# Patient Record
Sex: Female | Born: 1965
Health system: Southern US, Community
[De-identification: ages and names within clinical notes are randomized; demographics above are authoritative.]

## PROBLEM LIST (undated history)

## (undated) DIAGNOSIS — G5600 Carpal tunnel syndrome, unspecified upper limb: Secondary | ICD-10-CM

## (undated) DIAGNOSIS — I10 Essential (primary) hypertension: Secondary | ICD-10-CM

## (undated) DIAGNOSIS — M1990 Unspecified osteoarthritis, unspecified site: Secondary | ICD-10-CM

## (undated) DIAGNOSIS — T8859XA Other complications of anesthesia, initial encounter: Secondary | ICD-10-CM

## (undated) DIAGNOSIS — E669 Obesity, unspecified: Secondary | ICD-10-CM

## (undated) DIAGNOSIS — I82409 Acute embolism and thrombosis of unspecified deep veins of unspecified lower extremity: Secondary | ICD-10-CM

## (undated) DIAGNOSIS — I2699 Other pulmonary embolism without acute cor pulmonale: Secondary | ICD-10-CM

## (undated) DIAGNOSIS — E559 Vitamin D deficiency, unspecified: Secondary | ICD-10-CM

## (undated) DIAGNOSIS — T4145XA Adverse effect of unspecified anesthetic, initial encounter: Secondary | ICD-10-CM

## (undated) DIAGNOSIS — R079 Chest pain, unspecified: Secondary | ICD-10-CM

## (undated) HISTORY — DX: Vitamin D deficiency, unspecified: E55.9

## (undated) HISTORY — PX: CARPAL TUNNEL RELEASE: SHX101

## (undated) HISTORY — DX: Unspecified osteoarthritis, unspecified site: M19.90

## (undated) HISTORY — DX: Obesity, unspecified: E66.9

## (undated) HISTORY — DX: Carpal tunnel syndrome, unspecified upper limb: G56.00

## (undated) HISTORY — DX: Acute embolism and thrombosis of unspecified deep veins of unspecified lower extremity: I82.409

## (undated) HISTORY — PX: TUBAL LIGATION: SHX77

## (undated) HISTORY — PX: KNEE SURGERY: SHX244

## (undated) HISTORY — DX: Other pulmonary embolism without acute cor pulmonale: I26.99

## (undated) HISTORY — DX: Essential (primary) hypertension: I10

---

## 1999-06-13 ENCOUNTER — Emergency Department (HOSPITAL_COMMUNITY): Admission: EM | Admit: 1999-06-13 | Discharge: 1999-06-13 | Payer: Self-pay | Admitting: Emergency Medicine

## 1999-06-13 ENCOUNTER — Encounter: Payer: Self-pay | Admitting: Emergency Medicine

## 1999-11-03 ENCOUNTER — Encounter: Payer: Self-pay | Admitting: Emergency Medicine

## 1999-11-03 ENCOUNTER — Inpatient Hospital Stay: Admission: EM | Admit: 1999-11-03 | Discharge: 1999-11-04 | Payer: Self-pay | Admitting: Emergency Medicine

## 1999-11-04 ENCOUNTER — Encounter: Payer: Self-pay | Admitting: Cardiovascular Disease

## 2000-01-22 ENCOUNTER — Emergency Department (HOSPITAL_COMMUNITY): Admission: EM | Admit: 2000-01-22 | Discharge: 2000-01-22 | Payer: Self-pay | Admitting: Internal Medicine

## 2000-02-03 ENCOUNTER — Encounter: Payer: Self-pay | Admitting: Emergency Medicine

## 2000-02-03 ENCOUNTER — Emergency Department (HOSPITAL_COMMUNITY): Admission: EM | Admit: 2000-02-03 | Discharge: 2000-02-03 | Payer: Self-pay | Admitting: Emergency Medicine

## 2000-02-11 ENCOUNTER — Emergency Department (HOSPITAL_COMMUNITY): Admission: EM | Admit: 2000-02-11 | Discharge: 2000-02-11 | Payer: Self-pay | Admitting: Emergency Medicine

## 2000-09-01 ENCOUNTER — Emergency Department (HOSPITAL_COMMUNITY): Admission: EM | Admit: 2000-09-01 | Discharge: 2000-09-01 | Payer: Self-pay | Admitting: Emergency Medicine

## 2000-09-04 ENCOUNTER — Inpatient Hospital Stay (HOSPITAL_COMMUNITY): Admission: RE | Admit: 2000-09-04 | Discharge: 2000-09-06 | Payer: Self-pay | Admitting: Cardiology

## 2000-09-04 ENCOUNTER — Encounter: Payer: Self-pay | Admitting: Cardiology

## 2000-09-05 ENCOUNTER — Encounter: Payer: Self-pay | Admitting: Cardiology

## 2001-07-12 ENCOUNTER — Emergency Department (HOSPITAL_COMMUNITY): Admission: EM | Admit: 2001-07-12 | Discharge: 2001-07-12 | Payer: Self-pay | Admitting: Emergency Medicine

## 2002-08-05 ENCOUNTER — Emergency Department (HOSPITAL_COMMUNITY): Admission: EM | Admit: 2002-08-05 | Discharge: 2002-08-06 | Payer: Self-pay | Admitting: Emergency Medicine

## 2003-01-17 ENCOUNTER — Encounter: Payer: Self-pay | Admitting: *Deleted

## 2003-01-18 ENCOUNTER — Inpatient Hospital Stay (HOSPITAL_COMMUNITY): Admission: EM | Admit: 2003-01-18 | Discharge: 2003-01-20 | Payer: Self-pay | Admitting: *Deleted

## 2003-01-20 ENCOUNTER — Encounter: Payer: Self-pay | Admitting: Cardiology

## 2003-02-03 ENCOUNTER — Ambulatory Visit (HOSPITAL_COMMUNITY): Admission: RE | Admit: 2003-02-03 | Discharge: 2003-02-03 | Payer: Self-pay | Admitting: Cardiology

## 2003-05-22 ENCOUNTER — Emergency Department (HOSPITAL_COMMUNITY): Admission: EM | Admit: 2003-05-22 | Discharge: 2003-05-22 | Payer: Self-pay | Admitting: Emergency Medicine

## 2004-02-13 ENCOUNTER — Emergency Department (HOSPITAL_COMMUNITY): Admission: EM | Admit: 2004-02-13 | Discharge: 2004-02-14 | Payer: Self-pay | Admitting: Emergency Medicine

## 2004-03-01 ENCOUNTER — Ambulatory Visit: Admission: RE | Admit: 2004-03-01 | Discharge: 2004-03-01 | Payer: Self-pay | Admitting: Cardiology

## 2004-03-01 ENCOUNTER — Encounter (INDEPENDENT_AMBULATORY_CARE_PROVIDER_SITE_OTHER): Payer: Self-pay | Admitting: Cardiology

## 2005-03-06 ENCOUNTER — Ambulatory Visit: Payer: Self-pay | Admitting: Family Medicine

## 2005-04-27 ENCOUNTER — Ambulatory Visit: Payer: Self-pay | Admitting: Internal Medicine

## 2005-04-27 ENCOUNTER — Other Ambulatory Visit: Admission: RE | Admit: 2005-04-27 | Discharge: 2005-04-27 | Payer: Self-pay | Admitting: *Deleted

## 2005-06-22 ENCOUNTER — Emergency Department (HOSPITAL_COMMUNITY): Admission: EM | Admit: 2005-06-22 | Discharge: 2005-06-22 | Payer: Self-pay | Admitting: Emergency Medicine

## 2005-06-27 ENCOUNTER — Ambulatory Visit: Payer: Self-pay | Admitting: Family Medicine

## 2005-06-28 ENCOUNTER — Ambulatory Visit: Payer: Self-pay | Admitting: Family Medicine

## 2006-01-25 ENCOUNTER — Ambulatory Visit: Payer: Self-pay | Admitting: Family Medicine

## 2006-02-01 ENCOUNTER — Ambulatory Visit: Payer: Self-pay | Admitting: Family Medicine

## 2006-02-28 ENCOUNTER — Ambulatory Visit: Payer: Self-pay | Admitting: Family Medicine

## 2006-03-07 ENCOUNTER — Encounter: Admission: RE | Admit: 2006-03-07 | Discharge: 2006-03-07 | Payer: Self-pay | Admitting: *Deleted

## 2006-03-15 ENCOUNTER — Ambulatory Visit (HOSPITAL_COMMUNITY): Admission: RE | Admit: 2006-03-15 | Discharge: 2006-03-15 | Payer: Self-pay | Admitting: *Deleted

## 2006-03-19 ENCOUNTER — Ambulatory Visit: Payer: Self-pay | Admitting: Internal Medicine

## 2006-03-19 ENCOUNTER — Inpatient Hospital Stay (HOSPITAL_COMMUNITY): Admission: EM | Admit: 2006-03-19 | Discharge: 2006-03-28 | Payer: Self-pay | Admitting: Emergency Medicine

## 2006-03-29 ENCOUNTER — Ambulatory Visit: Payer: Self-pay | Admitting: Family Medicine

## 2006-03-31 ENCOUNTER — Encounter (INDEPENDENT_AMBULATORY_CARE_PROVIDER_SITE_OTHER): Payer: Self-pay | Admitting: *Deleted

## 2006-03-31 ENCOUNTER — Ambulatory Visit (HOSPITAL_COMMUNITY): Admission: AD | Admit: 2006-03-31 | Discharge: 2006-03-31 | Payer: Self-pay | Admitting: *Deleted

## 2006-04-03 ENCOUNTER — Ambulatory Visit: Payer: Self-pay | Admitting: Family Medicine

## 2006-04-10 ENCOUNTER — Ambulatory Visit: Payer: Self-pay | Admitting: Family Medicine

## 2006-04-23 ENCOUNTER — Ambulatory Visit: Payer: Self-pay | Admitting: Family Medicine

## 2006-05-02 ENCOUNTER — Ambulatory Visit: Payer: Self-pay | Admitting: Internal Medicine

## 2006-05-17 ENCOUNTER — Ambulatory Visit: Payer: Self-pay | Admitting: Internal Medicine

## 2006-05-18 ENCOUNTER — Ambulatory Visit: Payer: Self-pay | Admitting: Cardiology

## 2006-06-14 ENCOUNTER — Ambulatory Visit: Payer: Self-pay | Admitting: Internal Medicine

## 2006-12-20 ENCOUNTER — Encounter: Admission: RE | Admit: 2006-12-20 | Discharge: 2006-12-20 | Payer: Self-pay | Admitting: Family Medicine

## 2007-01-03 ENCOUNTER — Encounter: Admission: RE | Admit: 2007-01-03 | Discharge: 2007-01-03 | Payer: Self-pay | Admitting: Obstetrics and Gynecology

## 2007-01-30 ENCOUNTER — Ambulatory Visit (HOSPITAL_COMMUNITY): Admission: RE | Admit: 2007-01-30 | Discharge: 2007-01-30 | Payer: Self-pay | Admitting: Internal Medicine

## 2007-01-30 ENCOUNTER — Ambulatory Visit: Payer: Self-pay | Admitting: Internal Medicine

## 2007-06-13 ENCOUNTER — Emergency Department (HOSPITAL_COMMUNITY): Admission: EM | Admit: 2007-06-13 | Discharge: 2007-06-14 | Payer: Self-pay | Admitting: Emergency Medicine

## 2009-01-20 ENCOUNTER — Emergency Department (HOSPITAL_COMMUNITY): Admission: EM | Admit: 2009-01-20 | Discharge: 2009-01-20 | Payer: Self-pay | Admitting: Emergency Medicine

## 2009-01-24 ENCOUNTER — Inpatient Hospital Stay (HOSPITAL_COMMUNITY): Admission: EM | Admit: 2009-01-24 | Discharge: 2009-01-27 | Payer: Self-pay | Admitting: Cardiology

## 2009-01-24 ENCOUNTER — Encounter: Payer: Self-pay | Admitting: *Deleted

## 2010-02-13 ENCOUNTER — Ambulatory Visit: Payer: Self-pay | Admitting: Physician Assistant

## 2010-02-13 ENCOUNTER — Inpatient Hospital Stay (HOSPITAL_COMMUNITY): Admission: AD | Admit: 2010-02-13 | Discharge: 2010-02-14 | Payer: Self-pay | Admitting: Obstetrics and Gynecology

## 2010-02-16 ENCOUNTER — Inpatient Hospital Stay (HOSPITAL_COMMUNITY): Admission: AD | Admit: 2010-02-16 | Discharge: 2010-02-16 | Payer: Self-pay | Admitting: Obstetrics & Gynecology

## 2010-10-23 ENCOUNTER — Emergency Department (HOSPITAL_COMMUNITY)
Admission: EM | Admit: 2010-10-23 | Discharge: 2010-10-23 | Payer: Self-pay | Source: Home / Self Care | Admitting: Emergency Medicine

## 2010-10-23 ENCOUNTER — Encounter (INDEPENDENT_AMBULATORY_CARE_PROVIDER_SITE_OTHER): Payer: Self-pay | Admitting: Emergency Medicine

## 2010-11-13 HISTORY — PX: LEFT HEART CATH: CATH118248

## 2010-12-04 ENCOUNTER — Encounter: Payer: Self-pay | Admitting: *Deleted

## 2010-12-04 ENCOUNTER — Encounter: Payer: Self-pay | Admitting: Physical Medicine and Rehabilitation

## 2010-12-04 ENCOUNTER — Encounter: Payer: Self-pay | Admitting: Family Medicine

## 2011-02-01 LAB — URINALYSIS, ROUTINE W REFLEX MICROSCOPIC
Ketones, ur: NEGATIVE mg/dL
Leukocytes, UA: NEGATIVE
Nitrite: NEGATIVE
Protein, ur: NEGATIVE mg/dL
Urobilinogen, UA: 1 mg/dL (ref 0.0–1.0)
pH: 6 (ref 5.0–8.0)

## 2011-02-01 LAB — GC/CHLAMYDIA PROBE AMP, GENITAL: GC Probe Amp, Genital: NEGATIVE

## 2011-02-01 LAB — CBC
HCT: 43.5 % (ref 36.0–46.0)
MCV: 83.5 fL (ref 78.0–100.0)
Platelets: 284 10*3/uL (ref 150–400)
RBC: 4.83 MIL/uL (ref 3.87–5.11)
RBC: 5.21 MIL/uL — ABNORMAL HIGH (ref 3.87–5.11)
WBC: 10.3 10*3/uL (ref 4.0–10.5)
WBC: 10.4 10*3/uL (ref 4.0–10.5)

## 2011-02-01 LAB — URINE MICROSCOPIC-ADD ON

## 2011-02-01 LAB — POCT PREGNANCY, URINE: Preg Test, Ur: NEGATIVE

## 2011-02-01 LAB — WET PREP, GENITAL

## 2011-02-23 ENCOUNTER — Inpatient Hospital Stay (HOSPITAL_COMMUNITY)
Admission: AD | Admit: 2011-02-23 | Discharge: 2011-02-23 | Disposition: A | Discharge status: Home / Self Care | Payer: Self-pay | Source: Ambulatory Visit | Attending: Obstetrics & Gynecology | Admitting: Obstetrics & Gynecology

## 2011-02-23 DIAGNOSIS — N949 Unspecified condition associated with female genital organs and menstrual cycle: Secondary | ICD-10-CM | POA: Insufficient documentation

## 2011-02-23 DIAGNOSIS — N938 Other specified abnormal uterine and vaginal bleeding: Secondary | ICD-10-CM | POA: Insufficient documentation

## 2011-02-23 LAB — CBC
HCT: 37.1 % (ref 36.0–46.0)
HCT: 38.2 % (ref 36.0–46.0)
HCT: 46.4 % — ABNORMAL HIGH (ref 36.0–46.0)
Hemoglobin: 12.4 g/dL (ref 12.0–15.0)
Hemoglobin: 12.6 g/dL (ref 12.0–15.0)
Hemoglobin: 12.7 g/dL (ref 12.0–15.0)
Hemoglobin: 12.9 g/dL (ref 12.0–15.0)
MCHC: 32.9 g/dL (ref 30.0–36.0)
MCHC: 33.7 g/dL (ref 30.0–36.0)
MCV: 83.4 fL (ref 78.0–100.0)
MCV: 81.8 fL (ref 78.0–100.0)
MCV: 83.2 fL (ref 78.0–100.0)
Platelets: 218 10*3/uL (ref 150–400)
Platelets: 275 10*3/uL (ref 150–400)
RBC: 4.51 MIL/uL (ref 3.87–5.11)
RBC: 4.66 MIL/uL (ref 3.87–5.11)
RBC: 4.7 MIL/uL (ref 3.87–5.11)
RBC: 5.57 MIL/uL — ABNORMAL HIGH (ref 3.87–5.11)
RDW: 15.3 % (ref 11.5–15.5)
RDW: 15.4 % (ref 11.5–15.5)
WBC: 9.5 10*3/uL (ref 4.0–10.5)
WBC: 7.9 10*3/uL (ref 4.0–10.5)
WBC: 8.4 10*3/uL (ref 4.0–10.5)

## 2011-02-23 LAB — HEPARIN LEVEL (UNFRACTIONATED)
Heparin Unfractionated: 0.81 IU/mL — ABNORMAL HIGH (ref 0.30–0.70)
Heparin Unfractionated: 0.37 IU/mL (ref 0.30–0.70)

## 2011-02-23 LAB — CARDIAC PANEL(CRET KIN+CKTOT+MB+TROPI)
CK, MB: 0.6 ng/mL (ref 0.3–4.0)
CK, MB: 0.7 ng/mL (ref 0.3–4.0)
Relative Index: INVALID (ref 0.0–2.5)
Relative Index: INVALID (ref 0.0–2.5)
Total CK: 109 U/L (ref 7–177)
Total CK: 95 U/L (ref 7–177)
Troponin I: <0.01 ng/mL (ref 0.00–0.06)
Troponin I: <0.01 ng/mL (ref 0.00–0.06)

## 2011-02-23 LAB — COMPREHENSIVE METABOLIC PANEL
ALT: 14 U/L (ref 0–35)
AST: 19 U/L (ref 0–37)
Alkaline Phosphatase: 52 U/L (ref 39–117)
CO2: 28 mEq/L (ref 19–32)
Calcium: 8.4 mg/dL (ref 8.4–10.5)
Chloride: 99 mEq/L (ref 96–112)
GFR calc Af Amer: >60 mL/min (ref >60)
GFR calc non Af Amer: >60 mL/min (ref >60)
Glucose, Bld: 111 mg/dL — ABNORMAL HIGH (ref 70–99)
Potassium: 3.8 mEq/L (ref 3.5–5.1)
Sodium: 134 mEq/L — ABNORMAL LOW (ref 135–145)
Total Bilirubin: 0.5 mg/dL (ref 0.3–1.2)

## 2011-02-23 LAB — APTT: aPTT: 28 seconds (ref 24–37)

## 2011-02-23 LAB — BASIC METABOLIC PANEL
CO2: 27 mEq/L (ref 19–32)
Calcium: 9.5 mg/dL (ref 8.4–10.5)
Chloride: 103 mEq/L (ref 96–112)
Creatinine, Ser: 0.76 mg/dL (ref 0.4–1.2)
GFR calc Af Amer: >60 mL/min (ref >60)
GFR calc Af Amer: >60 mL/min (ref >60)
GFR calc non Af Amer: >60 mL/min (ref >60)
Glucose, Bld: 104 mg/dL — ABNORMAL HIGH (ref 70–99)
Potassium: 4.2 mEq/L (ref 3.5–5.1)
Sodium: 139 mEq/L (ref 135–145)

## 2011-02-23 LAB — ETHANOL: Alcohol, Ethyl (B): <5 mg/dL (ref 0–10)

## 2011-02-23 LAB — DIFFERENTIAL
Basophils Relative: 0 % (ref 0–1)
Eosinophils Relative: 1 % (ref 0–5)
Lymphs Abs: 2.1 10*3/uL (ref 0.7–4.0)
Monocytes Absolute: 0.8 10*3/uL (ref 0.1–1.0)
Monocytes Relative: 10 % (ref 3–12)

## 2011-02-23 LAB — PROTIME-INR
INR: 1 (ref 0.00–1.49)
INR: 1 (ref 0.00–1.49)

## 2011-02-23 LAB — URINALYSIS, ROUTINE W REFLEX MICROSCOPIC
Bilirubin Urine: NEGATIVE
Glucose, UA: NEGATIVE mg/dL
Hgb urine dipstick: NEGATIVE
Ketones, ur: NEGATIVE mg/dL
Nitrite: NEGATIVE
Protein, ur: NEGATIVE mg/dL
Urobilinogen, UA: 0.2 mg/dL (ref 0.0–1.0)
pH: 6.5 (ref 5.0–8.0)

## 2011-02-23 LAB — LIPID PANEL
HDL: 43 mg/dL (ref >39)
Total CHOL/HDL Ratio: 4 RATIO

## 2011-02-23 LAB — POCT CARDIAC MARKERS
CKMB, poc: <1 ng/mL — ABNORMAL LOW (ref 1.0–8.0)
Troponin i, poc: <0.05 ng/mL (ref 0.00–0.09)

## 2011-02-23 LAB — HEMOGLOBIN A1C: Hgb A1c MFr Bld: 5.9 % (ref 4.6–6.1)

## 2011-02-23 LAB — CK TOTAL AND CKMB (NOT AT ARMC)
CK, MB: 1.2 ng/mL (ref 0.3–4.0)
Relative Index: 0.9 (ref 0.0–2.5)
Total CK: 132 U/L (ref 7–177)

## 2011-02-23 LAB — URINE CULTURE

## 2011-02-23 LAB — RAPID URINE DRUG SCREEN, HOSP PERFORMED: Tetrahydrocannabinol: NOT DETECTED

## 2011-03-01 ENCOUNTER — Inpatient Hospital Stay (HOSPITAL_COMMUNITY): Payer: Self-pay

## 2011-03-01 ENCOUNTER — Inpatient Hospital Stay (HOSPITAL_COMMUNITY)
Admission: AD | Admit: 2011-03-01 | Discharge: 2011-03-01 | Disposition: A | Discharge status: Home / Self Care | Payer: Self-pay | Source: Ambulatory Visit | Attending: Obstetrics & Gynecology | Admitting: Obstetrics & Gynecology

## 2011-03-01 DIAGNOSIS — N949 Unspecified condition associated with female genital organs and menstrual cycle: Secondary | ICD-10-CM

## 2011-03-01 DIAGNOSIS — N938 Other specified abnormal uterine and vaginal bleeding: Secondary | ICD-10-CM | POA: Insufficient documentation

## 2011-03-01 LAB — URINALYSIS, ROUTINE W REFLEX MICROSCOPIC
Ketones, ur: NEGATIVE mg/dL
Leukocytes, UA: NEGATIVE
Protein, ur: NEGATIVE mg/dL
Urobilinogen, UA: 0.2 mg/dL (ref 0.0–1.0)

## 2011-03-01 LAB — WET PREP, GENITAL
Clue Cells Wet Prep HPF POC: NONE SEEN
Yeast Wet Prep HPF POC: NONE SEEN

## 2011-03-01 LAB — CBC
Platelets: 268 10*3/uL (ref 150–400)
RBC: 5.2 MIL/uL — ABNORMAL HIGH (ref 3.87–5.11)
WBC: 8.7 10*3/uL (ref 4.0–10.5)

## 2011-03-01 LAB — URINE MICROSCOPIC-ADD ON

## 2011-03-02 LAB — GC/CHLAMYDIA PROBE AMP, GENITAL: Chlamydia, DNA Probe: NEGATIVE

## 2011-03-24 ENCOUNTER — Inpatient Hospital Stay (INDEPENDENT_AMBULATORY_CARE_PROVIDER_SITE_OTHER)
Admission: RE | Admit: 2011-03-24 | Discharge: 2011-03-24 | Disposition: A | Discharge status: Home / Self Care | Payer: Self-pay | Source: Ambulatory Visit | Attending: Family Medicine | Admitting: Family Medicine

## 2011-03-24 ENCOUNTER — Ambulatory Visit (INDEPENDENT_AMBULATORY_CARE_PROVIDER_SITE_OTHER): Payer: Self-pay | Admitting: Physician Assistant

## 2011-03-24 ENCOUNTER — Encounter: Payer: Self-pay | Admitting: Family Medicine

## 2011-03-24 DIAGNOSIS — R6889 Other general symptoms and signs: Secondary | ICD-10-CM

## 2011-03-24 DIAGNOSIS — N938 Other specified abnormal uterine and vaginal bleeding: Secondary | ICD-10-CM

## 2011-03-24 DIAGNOSIS — N949 Unspecified condition associated with female genital organs and menstrual cycle: Secondary | ICD-10-CM

## 2011-03-28 NOTE — Discharge Summary (Signed)
NAMECONSTANCE, WHITTLE               ACCOUNT NO.:  [id number]   MEDICAL RECORD NO.:  [id number]          PATIENT TYPE:  INP   LOCATION:  3739                         FACILITY:  [facility]   PHYSICIAN:  Mohan N. Sharyn Lull, M.D. DATE OF BIRTH:  April 15, 1966   DATE OF ADMISSION:  01/24/2009  DATE OF DISCHARGE:  01/27/2009                               DISCHARGE SUMMARY   ADMITTING DIAGNOSES:  1. Chest pain, rule out myocardial infarction.  2. Hypertension.  3. Morbid obesity.  4. Degenerative joint disease.  5. Positive family history of coronary artery disease.  6. Hypercholesteremia.   DISCHARGE DIAGNOSES:  1. Status post chest pain, myocardial infarction ruled out.  2. Negative Persantine Myoview.  3. Rule out gastroesophageal reflux disease.  4. Hypertension.  5. Hypercholesteremia, controlled by diet.  6. Morbid obesity.  7. Degenerative joint disease.  8. Positive family history of coronary artery disease.   DISCHARGE HOME MEDICATIONS:  1. Enteric-coated aspirin 81 mg 1 tablet daily.  2. Toprol-XL 50 mg 1 tablet daily.  3. Nexium 40 mg 1 capsule daily half an hour before breakfast.  4. Nitrostat 0.4 mg sublingual, use as directed.   DIET:  Low salt, low cholesterol.  The patient has been advised to  reduce weight.   ACTIVITY:  Increase activity slowly as tolerated.   FOLLOWUP:  Follow up with me in 2 weeks.   CONDITION AT DISCHARGE:  Stable.   BRIEF HISTORY AND HOSPITAL COURSE:  Ms. Lewan is a 45 year old black  female with past medical history significant for hypertension,  hypercholesteremia, degenerative joint disease, morbid obesity, positive  family history of coronary artery disease.  She came to the ER  complaining of retrosternal chest pressure, grade 7/10, radiating to the  back of the neck associated with nausea since yesterday afternoon off  and on.  Denies any history of exertional chest pain, but complains of  exertional dyspnea.  Denies PND, orthopnea, or leg  swelling.  Denies  palpitation, light headedness, or syncope.  Denies relational chest pain  to food, breathing, or movement.  Denies any cough, fever, chills.  Denies any history of claudication pains.   PAST MEDICAL HISTORY:  As above.   PAST SURGICAL HISTORY:  She had C-section x2.  Had left carpal tunnel  surgery in the past.  Had left knee arthroscopic surgery in the past.   ALLERGIES:  No known drug allergies.   MEDICATIONS AT HOME:  She is on ibuprofen and Vicodin.   SOCIAL HISTORY:  She is married, has 4 children.  No history of smoking  or alcohol abuse.  She works as an Airline pilot.   FAMILY HISTORY:  Positive for coronary artery disease.   PHYSICAL EXAMINATION:  GENERAL:  She is alert, awake, oriented x3, in no  acute distress.  VITAL SIGNS:  Initial blood pressure in the ER was 176/107, repeat  pressures came down to 146/94, 104/64 on nitro drip; pulse initial was  98 regular.  The patient was afebrile.  HEENT:  Conjunctivae were pink.  NECK:  Supple.  No JVD.  No bruit.  LUNGS:  Clear to  auscultation without rhonchi or rales.  CARDIOVASCULAR:  S1 and S2 were normal.  There was soft systolic murmur.  ABDOMEN:  Soft.  Bowel sounds were present.  Obese, nontender.  EXTREMITIES:  There was no clubbing, cyanosis, or edema.   LABORATORY DATA:  Hemoglobin was 15.1, hematocrit 46.4, white count of  8.4, platelet count of 275,000.  Sodium 139, potassium 4.2, chloride  104, bicarb 29, glucose 105, BUN 10, creatinine 0.69.  Two sets of  cardiac enzymes were negative.  Urinalysis was normal.  Urine pregnancy  test was negative.  Urine drug screen was negative for cocaine.  Repeat  fasting sugar on January 25, 2009, was 104 which was slightly elevated.  Hemoglobin A1c was 5.9.  Total cholesterol was 171, HDL was 43, LDL 107,  triglycerides were 104.  Persantine Myoview scan was done on Monday and  then repeat Myoview scan was done today which demonstrated no fixed or  reversible  perfusion defect.  Ejection fraction of 59%.   BRIEF HOSPITAL COURSE:  The patient was admitted to Telemetry Unit.  MI  was ruled out by serial enzymes and EKG.  The patient was started on IV  heparin and nitrates.  The patient did not have any anginal chest pain  during the hospital stay.  The patient subsequently underwent Persantine  Myoview which showed no evidence of reversible ischemia with normal LV  function.  The patient did not have any episodes of anginal chest pain  during the hospital stay.  The patient will be discharged home on above  medications and will be followed up in my office in 2 weeks.  The  patient also has been advised to stay away from carbohydrates, sweets.  We will repeat her lipid panel and hemoglobin A1c in 3-6 months.  The  patient will be followed up in my office in 2 weeks.  Nutrition consult  was also called during the hospital stay.  The patient states that she  has already joined the gym and intend to watch her diet and exercise and  reduce weight.      Eduardo Osier. Sharyn Lull, M.D.  Electronically Signed    MNH/MEDQ  D:  01/27/2009  T:  01/28/2009  Job:  [id number]

## 2011-03-31 NOTE — Cardiovascular Report (Signed)
NAME:  LATIFFANY, HARWICK                         ACCOUNT NO.:  [id number]   MEDICAL RECORD NO.:  [id number]                   PATIENT TYPE:  OIB   LOCATION:  2856                                 FACILITY:  [facility]   PHYSICIAN:  Mohan N. Sharyn Lull, M.D.              DATE OF BIRTH:  20-Jan-1966   DATE OF PROCEDURE:  02/03/2003  DATE OF DISCHARGE:  02/03/2003                              CARDIAC CATHETERIZATION   PROCEDURE:  Left cardiac catheterization with selective left and right  coronary angiography, left ventriculography via right groin using Judkins  technique.   INDICATIONS FOR PROCEDURE:  The patient is a 45 year old black female with a  past medical history significant for hypertension, hypercholesterolemia,  morbid obesity, GERD, recently discharged from the hospital, complains of  recurrent retrosternal chest tightness radiating to the jaw and left arm  with minimal exertion.  Relieved with rest and sublingual nitroglycerin.  The patient also complains of exertional dyspnea.  Denies any nausea,  vomiting, diaphoresis.  Denies palpitations, lightheadedness, or syncope.  Denies PND, orthopnea, leg swelling.  The patient recently had a Persantine  Cardiolite which was negative for ischemia.  Due to typical anginal chest  pain and multiple risk factors, discussed with patient regarding left  catheterization, possible PTCA stenting, and consented for the procedure.   PAST MEDICAL HISTORY:  As above.   PAST SURGICAL HISTORY:  She had left knee surgery in 1997.  Had carpal  tunnel surgery in 1997.  Had 2 C-sections in 1987 and 1988.   MEDICATIONS:  At home, she is on Toprol XL 100 mg p.o. q.d.,  hydrochlorothiazide 25 mg p.o. q.d., baby aspirin 81 mg p.o. q.d., Nexium 40  mg p.o. q.d., Lipitor 10 mg p.o. q.d., Xanax 0.25 mg p.o. b.i.d., Nitrostat  sublingual p.r.n.   SOCIAL HISTORY:  She is married, 3 children.  No history of smoking or  alcohol abuse.  Works as an Airline pilot.  She  was born in [place].   FAMILY HISTORY:  Father is alive; he is in his 28s, has coronary artery  disease.  Mother is 14, also has coronary artery disease.  One sister in  good health.   PHYSICAL EXAMINATION:  GENERAL:  On examination, she is alert, awake,  oriented x3, in no acute distress.  VITAL SIGNS:  Blood pressure was 130/86.  Pulse was 76, regular.  HEENT:  Conjunctivae was pink.  NECK:  Supple, no JVD, no bruits.  LUNGS:  Clear to auscultation without rhonchi or rales.  CARDIOVASCULAR EXAM:  S1, S2 was normal.  There was no S3 gallop or murmur.  ABDOMEN:  Soft.  Bowel sounds are present.  Obese, nontender.  EXTREMITIES:  There was no cyanosis, clubbing, or edema.   IMPRESSION:  Accelerated angina.  Rule out coronary insufficiency.  Hypertension, hypercholesterolemia, morbid obesity, gastroesophageal reflux  disease.   Discussed with patient regarding left catheterization, possible PTCA and  stenting, its  risks, i.e., death, MI, stroke, need for emergency CABG, risk  of restenosis, local vascular complications, etc., and consented for PCI.   DESCRIPTION OF PROCEDURE:  After obtaining the informed consent, the patient  was brought to the catheterization lab and was placed on the fluoroscopy  table.  The right groin was prepped and draped in the usual fashion.  Xylocaine, 2%, was used for local anesthesia in the right groin.  With the  help of a thin-walled needle, a 6-French arterial sheath was placed.  The  sheath was aspirated and flushed.  Next, a 6-French left Judkins catheter  was advanced over the wire under fluoroscopic guidance up to the ascending  aorta.  The wire was pulled out.  The catheter was aspirated and connected  to the manifold.  The catheter was further advanced and engaged into the  left coronary ostium.  Multiple views of the left system were taken.  Next,  the catheter was disengaged and was pulled out over the wire and was  replaced with a 6-French  right Judkins catheter which was advanced over the  wire under fluoroscopic guidance up to the ascending aorta.  The wire was  pulled out.  The catheter was aspirated and connected to the manifold.  The  catheter was further advanced and engaged into the right coronary ostium.  Multiple views of the right coronary system were taken.  Next, the catheter  was disengaged and was pulled out over the wire and was replaced with a 6-  French pigtail catheter which was advanced over the wire under fluoroscopic  guidance up to the ascending aorta.  The wire was pulled out.  The catheter  was aspirated and connected to the manifold.  The catheter was further  advanced across the aortic valve into the LV.  LV pressures were recorded.  Next, left ventriculography was done in 30-degree RAO position.  Post  angiographic pressures were recorded from the left ventricle, and then  pullback pressures were recorded from the aorta.  There was no gradient off  the aortic valve.  Next, the pigtail catheter was pulled out over the wire.  Sheaths were aspirated and flushed.   FINDINGS:  1. The left ventricle showed good left ventricular systolic function.     Ejection fraction of 60% to 65%.  2. The left main was patent.  3. The left anterior descending artery has 20% distal stenosis.  Diagonal 1     and diagonal 2 were patent.  4. The left circumflex has 10% to 15% ostial stenosis and then tapers down     an AV groove after giving off obtuse marginal 2.  Obtuse marginal 1 was     medium-sized which bifurcates into superior and inferior branch which     were patent.  Obtuse marginal 2 was small which was patent.  5. The right coronary artery was patent proximally and in the mid portion.     The vessel was small and diffusely diseased distally.   The patient tolerated the procedure well.  There were no complications.  The  patient was transferred to the recovery room in stable condition.                                               Eduardo Osier. Sharyn Lull, M.D.    MNH/MEDQ  D:  02/03/2003  T:  02/03/2003  Job:  [id number]   cc:   Cardiac Catheterization Lab

## 2011-03-31 NOTE — Discharge Summary (Signed)
NAME:  [name], [name]                         ACCOUNT NO.:  [id number]   MEDICAL RECORD NO.:  [id number]                   PATIENT TYPE:  INP   LOCATION:  3704                                 FACILITY:  [facility]   PHYSICIAN:  Eduardo Osier. Sharyn Lull, M.D.              DATE OF BIRTH:  August 07, 1966   DATE OF ADMISSION:  01/17/2003  DATE OF DISCHARGE:  01/20/2003                                 DISCHARGE SUMMARY   ADMISSION DIAGNOSES:  1. Chest pain, rule out myocardial infarction.  2. Morbid obesity.  3. Hypercholesterolemia.  4. Hypertension.  5. Gastroesophageal reflux disease.   DISCHARGE DIAGNOSES:  1. Chest pain, myocardial infarction ruled out, negative Persantine     Cardiolite.  2. Gastroesophageal reflux disease.  3. Hypertension.  4. Hypercholesterolemia.  5. Morbid obesity.  6. Anxiety disorder.   DISCHARGE MEDICATIONS:  1. Toprol XL 100 mg one tablet daily.  2. Hydrochlorothiazide 25 mg one tablet daily.  3. Lipitor 10 mg daily.  4. Nexium 40 mg one capsule daily half hour before breakfast.  5. Xanax 0.25 mg one tablet twice daily.  6. Baby aspirin 81 mg one tablet daily.  7. Nitrostat 0.4 mg sublingual use as directed.   ACTIVITY:  As tolerated.   DIET:  Low salt, low cholesterol.   FOLLOW UP:  Follow up with me in one week.   CONDITION ON DISCHARGE:  Stable.   HISTORY OF PRESENT ILLNESS:  The patient is a 45 year old black female with  a past medical history significant for hypertension, hypercholesterolemia,  morbid obesity, who was admitted by Dr. Shana Chute on March 6, because of  substernal chest pressure.  The patient states she started having chest  discomfort in October of 2001 and was evaluated with a Cardiolite study  which was negative and which showed no evidence of ischemia with EF of 59%.  She has been stable until two days ago when she complained of sensation of  elephant standing on the chest, complaining of radiation of pain to the left  arm  associated with diaphoresis, nausea, and dyspnea.  The patient states  the discomfort was mostly relieved by IV nitroglycerin in the ER. The  patient also reports that she has taken her father's nitroglycerin as she  was having chest pain with partial relief.  Denies any palpitations, but  felt weak after chest discomfort and felt also worse while playing with her  child today.  The patient was evaluated by EDP and was admitted for further  evaluation.   PAST MEDICAL HISTORY:  As above.   SOCIAL HISTORY:  She lives at home with husband. She has two children. She  works as an Airline pilot.   PHYSICAL EXAMINATION:  GENERAL: Alert, awake, oriented x3 in no acute  distress.  HEENT:  Conjunctivae pink.  NECK:  Supple.  No JVD and no bruits.  LUNGS:  Clear to auscultation.  HEART:  S1 and  S2 were normal.  ABDOMEN:  Soft, bowel sounds present, nontender.  EXTREMITIES:  No cyanosis, clubbing, or edema.   LABORATORY DATA:  Cholesterol 192, LDL 121, triglycerides 99, HDL 51.  Two  sets of CPK-MBs were negative.  Three sets of troponin I were negative.  Hemoglobin 13.7, hematocrit 39.9, white count 10.6.  Sodium 142, potassium  3.9, chloride 105, bicarb 30. Blood sugar 116 which was slightly elevated.  BUN 7, creatinine 0.7.  Fasting blood sugar is pending.  The fasting blood  sugar in the past has been within normal range.   EKG showed normal sinus rhythm with nonspecific ST T wave changes.   HOSPITAL COURSE:  The patient was admitted to telemetry unit. MI was ruled  out by serial enzymes and EKG.  The patient underwent Persantine Cardiolite  today which showed no evidence of reversible ischemia with ejection fraction  of 63%. The patient had multiple EKGs during the hospital stay and had one  episode of chest pain during the hospital stay.  EKG during chest pain did  not suggest any evidence of new changes.  The patient did not have any  further episodes of chest pain during the hospital  stay. Cardiolite scan is  negative with normal wall motion and no evidence of ischemia with normal  ejection fraction.  The patient will be discharged home with the above  medications and will be followed up in the office in one week.                                               Eduardo Osier. Sharyn Lull, M.D.    MNH/MEDQ  D:  01/20/2003  T:  01/21/2003  Job:  [id number]

## 2011-03-31 NOTE — Op Note (Signed)
[name], [name]               ACCOUNT NO.:  [id number]   MEDICAL RECORD NO.:  [id number]          Williams TYPE:  AMB   LOCATION:  SDC                           FACILITY:  [facility]   PHYSICIAN:  Almedia Balls. Fore, M.D.   DATE OF BIRTH:  1966/10/16   DATE OF PROCEDURE:  03/31/2006  DATE OF DISCHARGE:  03/31/2006                                 OPERATIVE REPORT   PREOPERATIVE DIAGNOSES:  1.  Abnormal uterine bleeding.  2.  Uterine enlargement.  3.  Status post deep vein thrombosis with pulmonary embolus, on Coumadin.   POSTOPERATIVE DIAGNOSES:  1.  Abnormal uterine bleeding.  2.  Uterine enlargement.  3.  Status post deep vein thrombosis with pulmonary embolus, on Coumadin.  4.  Pending pathology.   OPERATION:  1.  Diagnostic hysteroscopy.  2.  Fractional dilatation and curettage.   ANESTHESIA:  General orotracheal.   OPERATOR:  Almedia Balls. [name], M.D.   INDICATIONS:  The Williams is a 45 year old with persisting abnormal uterine  bleeding in spite of hormonal suppression.  This was discontinued when it  was noted that she had a deep vein thrombosis and probable pulmonary embolus  within the past week and it has been treated with Coumadin.  She was fully  counseled as to the need for surgery and the type surgery to be performed to  include risks of anesthesia, injury to bowel, bladder, blood vessels,  ureters, postoperative hemorrhage, infection, injury to uterus, tubes and  ovaries and recuperation.  She fully understands all these considerations  and has signed an informed consent to proceed on Mar 31, 2006.   OPERATIVE FINDINGS:  On bimanual exam, the uterus was mid-posterior,  approximately 10 weeks' gestational size.  There were no palpable adnexal  masses.  On hysteroscopy, the uterus sounded to 10 cm.  The endocervical  canal was clean.  The endometrial cavity was slightly irregular with small  submucous myomata present.  There were also areas of shaggy endometrium with  polypoid-appearing areas.   PROCEDURE:  With the Williams under general anesthesia, prepared and draped  in the usual sterile fashion, a speculum was placed in vagina.  A solution  of 1% lidocaine was injected at 2, 4, 8 , 10 and 12 o'clock positions for a  total of 10 mL for a paracervical block.  A single-tooth tenaculum was  placed at the 12 o'clock position, and a small sharp curette was used for  curettage of the endocervical canal with a small amount of normal-appearing  tissue being obtained.  The uterus was sounded, as noted above, and the  cervix was dilated up through a #21 Pratt dilator.  Diagnostic hysteroscope  was introduced using free flow of [place] under direct vision with the above-  noted findings.  Hysteroscope was removed, and a medium sharp curette and  polyp forceps were used for removal of tissue from the endometrial cavity.  When no further tissue was obtained, the hysteroscope was employed to ensure  that tissue had been removed and that hemostasis was maintained.  After  noting that this was the case and  sponge and instrument counts were correct,  the procedure was terminated.  Estimated blood loss was less than 25 mL for  the procedure.  The Williams was taken to the recovery room in good  condition.   FOLLOWUP CARE:  She will be discharged from the PACU if vital signs were  stable and is to return to the office in approximately 2 weeks for followup.  She was instructed to call if heavy bleeding, pain or unexplained fever  should ensue.  She has a Darvocet-N and oxycodone at home for pain.           ______________________________  Almedia Balls. [name], M.D.     SRF/MEDQ  D:  03/31/2006  T:  04/02/2006  Job:  [id number]

## 2011-03-31 NOTE — Discharge Summary (Signed)
[name], [name]               ACCOUNT NO.:  [id number]   MEDICAL RECORD NO.:  [id number]          PATIENT TYPE:  INP   LOCATION:  1414                         FACILITY:  [facility]   PHYSICIAN:  Rene Paci, M.D. LHCDATE OF BIRTH:  03-17-1966   DATE OF ADMISSION:  03/19/2006  DATE OF DISCHARGE:  03/28/2006                                 DISCHARGE SUMMARY   DISCHARGE DIAGNOSIS:  1.  Right lower lobe acute pulmonary embolism with probable pulmonary      infarction, improved.  Continue anticoagulation.  See details below.  2.  Acute hypoxic respiratory insufficiency secondary to above, improved,      but to continue home O2 secondary desaturating with exertion.  See      below.  3.  Klebsiella urinary tract infection status post __________ of Cipro and      resolved.  4.  History of dyslipidemia with normal fasting lipid panel on May 9.  5.  History of gastroesophageal reflux disease.  Continue home PPI.  6.  Morbid obesity with probable metabolic syndrome.   DISCHARGE MEDICATIONS:  1.  Coumadin 5 mg one each p.m. or as directed by [facility] Clinic  2.  OxyContin 40 mg p.o. q.8h.  3.  Percocet 5/325 one p.o. q.4h. p.r.n. breakthrough pain.  4.  Patient is to discontinue her hormone pills which were a combination of      two including megestrol and estriol according to patient.  5.  Atenolol 50 mg p.o. daily.  6.  Protonix 40 mg p.o. daily.  7.  Colace 100 mg p.o. b.i.d.  8.  Muscle relaxer of choice as needed   DISPOSITION:  Patient is discharged home with home oxygen.  Close outpatient  follow-up will be arranged in the next 24-48 hours with [facility].  Appointment scheduling ongoing at this time.  Also follow up with  primary care physician, Dr. Alonza Smoker [facility] tomorrow, May 17 at  12 noon to review overall medical care.   CONDITION ON DISCHARGE:  Overall improved, better pain control on the above  regimen, saturating 93-96% at rest with 1 L,  but desaturating lower 80s on  room air with exertion.   HOSPITAL COURSE:  #1 - ACUTE PE:  Patient is a 45 year old woman who came to  the emergency room day of admission complaining of chest pain.  Work-up  diagnosed her with an acute lower lobe PE for which her risk factors include  hormone therapy, obesity, and sedentary lifestyle secondary to chronic  pelvic pain.  She was begun on full-dose Lovenox and Coumadin therapy per  the pharmacy protocol.  Patient had significant pleurisy and pain associated  with this PE and required large doses of pain medication which had been  continually titrated throughout this hospitalization.  For the last three  days prior to discharge patient has been very comfortable on the regimen of  40 mg long-acting OxyContin three times daily with breakthrough oxycodone as  provided through Percocet.  Her anticoagulation was managed without  difficulty by pharmacy and after a therapeutic overlap of Lovenox with  Coumadin once therapeutic was discontinued and she is now being managed on  Coumadin therapy alone.  The patient has been a difficult blood draw here in  the hospital so we will have follow-up at [facility] for ease  of Coumadin testing by fingerstick rather than by primary M.D. unless  patient achieves remarkable stability with her anticoagulation management.  [facility]in Clinic will be calling shortly with appointment time and follow up  upon release from hospital.  Only remaining issue requiring continued  observation was patient's hypoxic insufficiency.  Due to this we have  arranged home O2 as patient's resting O2 saturations appear to be reasonably  well controlled with minimal needs but exertional saturations drop as  mentioned above.  Will arrange for continuous home 2 L at all times with  continued monitoring as an outpatient, to discontinue oxygen when no longer  needed.  Patient's hormones have been discontinued and outpatient  follow-up  with primary M.D. and/or gynecologist will need to be made to ensure she  does not resume these medications when other medical modalities can be found  to manage her diagnoses   #2  - OTHER MEDICAL ISSUES:  Patient's other medical issues have been  continued as prior to admission with their management.  No changes were made  except as listed.  Discharge INR 2.8.  Saturations 94% on 1 L at rest.  Last  chest x-ray on May 15 showed a persistent right moderate effusion, but with  otherwise improved aeration.      Rene Paci, M.D. [facility]  Electronically Signed     VL/MEDQ  D:  03/28/2006  T:  03/28/2006  Job:  [id number]

## 2011-03-31 NOTE — Assessment & Plan Note (Signed)
[facility]                               PULMONARY OFFICE NOTE   [name], [name]                      MRN:          [id number]  DATE:06/14/2006                            DOB:          December 24, 1965    PULMONARY FINAL FOLLOW UP OFFICE VISIT:   HISTORY:  This is a 45 year old black female, never a smoker, with acute  right pleuritic pain on May 7th, diagnosed with pulmonary embolism by CT  scan, associated with small right pleural effusion, while actively taking  hormone replacement therapy.  I saw her on May 17, 2006, at Dr. Nelida Meuse  request, because of persistent right sided chest pain which she says is  still present but minimal and not to the point where she feels she needs  to take anything for it.  I repeated a CT scan of the chest, thinking she  might have a significant pleural effusion, and found that she had minimal  scarring in the right base with no effusion, consistent with a resolving  infarction.   Since that time she has improved in terms of activity tolerance with no  complaints of leg swelling, calf pain, fevers, chills, sweats or cough.   PHYSICAL EXAMINATION:  She is a pleasant ambulatory black female in no acute  distress.  She weighs 328 pounds, which is up another 10 pounds from  baseline from her last visit.  VITAL SIGNS:  Afebrile.  Pulse rate of 83.  HEENT:  Unremarkable.  LUNG FIELDS:  Are perfectly clear bilaterally to auscultation and percussion  with no rub.  Regular rate and rhythm without murmur, rub or gallop.  ABDOMEN:  Soft, benign.  EXTREMITIES:  Warm without calf tenderness, cyanosis, clubbing and edema.   Hemoglobin saturation is 99% on room air.   IMPRESSION:  Almost complete resolution of pleuritic pain that most likely  is a result of peripheral infarction with no associated with pleural  effusion.  I believe her prognosis for recovery from this pulmonary  embolism, therefore, is 100%, and at this  point she can return to work  effective June 18, 2006.   A larger issue is what to do in 5 more months (which will complete 6 months  of therapy) in terms of making a recommendation for long term therapy.  Clearly, the combination of obesity and hormone replacement therapy put her  at risk of clotting, but she has not yet addressed the issue of obesity and  unless she does make significant strides in this area I would think that the  risk of  recurrent DVT pulmonary embolism is significant and, therefore, would  warrant long term Coumadin therapy in the absence of a contraindication.  Pulmonary follow up can be p.r.n.                                   Charlaine Dalton. Sherene Sires, MD, [facility]   MBW/MedQ  DD:  06/14/2006  DT:  06/14/2006  Job #:  [id number]   cc:   Tinnie Gens A.  Tawanna Cooler, MD

## 2011-03-31 NOTE — Discharge Summary (Signed)
. [name]  Patient:    [name], [name]                      MRN: [id number] Adm. Date:  [id number] Disc. Date: 09/06/00 Attending:  Robynn Pane                           Discharge Summary  ADMISSION DIAGNOSES: 1. Chest pain, rule out coronary insufficiency. 2. Labile hypertension. 3. Morbid obesity. 4. Positive family history of coronary artery disease. 5. History of depression and anxiety disorder.  FINAL DIAGNOSES: 1. Atypical chest pain, rule out gastroesophageal reflux disease.    Rule out peptic ulcer disease. 2. New onset hypertension. 3. Morbid obesity. 4. Positive family history of coronary artery disease. 5. History of depression and anxiety disorder.  DISCHARGE HOME MEDICATIONS: 1. Toprol XL 50 mg 1 tablet daily. 2. Nexium 40 mg 1 capsule daily. 3. Xanax 0.25 mg 1 tablet twice daily.  ACTIVITY:  As tolerated.  DIET:  Low-fat, low-cholesterol, weight-reducing diet.  SPECIAL INSTRUCTIONS:  The patient had been advised to elevate the head end of the bed 30 degrees and avoid spicy food.  FOLLOWUP:  With me in 7 to 10 days.  CONDITION UPON DISCHARGE: Stable.  BRIEF HISTORY:  [name] is a 45 year old black female with past medical history significant for labile hypertension, morbid obesity, anxiety disorder, history of depression, positive family history of coronary artery disease. She complained of retrosternal chest heaviness radiating to the jaw, associated with diaphoresis off and on.  The patient also complains of exertional dyspnea.  She denies palpitations, lightheadedness, or syncope. She denies PND, orthopnea, leg swelling.  Denies relational chest pain to breathing, food, or movement.  The patient was seen in the office yesterday. EKG done showed normal sinus rhythm with nonspecific T wave changes.  The patient refused admission yesterday and was scheduled for Persantine Cardiolite today.  While in ______,  again experienced similar pain without associated symptoms and agreed to admission.  PAST MEDICAL HISTORY:  No history of MI, no history of congestive heart failure, no history of stroke, peptic ulcer disease, no history of asthma, no history of rheumatic heart disease, no history of diabetes.  She had labile elevated blood pressure, not on any medication.  History of depression in the past.  PAST SURGICAL HISTORY:  She had left knee surgery in 1997 and carpal tunnel surgery to left wrist in 1997.  She had two C sections in 1987 and 1998.  MEDICATIONS:  She was started on Nexium 40 mg p.o. q.d, baby aspirin 2 tablets daily, and Nitrostat sublingual p.r.n.  ALLERGIES:  No known drug allergies.  SOCIAL HISTORY:  She is married and has three children, works as an Airline pilot.  No history of alcohol abuse or drug abuse or tobacco abuse.  She was born and lives in [place].  FAMILY HISTORY:  Father is alive.  Mother is alive. She is 70.  She had coronary artery disease, pulmonary embolism, and deep venous thrombosis. Father is 2.  He had coronary artery disease.  One sister in good health.  PHYSICAL EXAMINATION:  GENERAL: Alert and oriented x 3 in no acute distress.  VITAL SIGNS:  Blood pressure 136/90, pulse 85.  HEENT:  Conjunctivae pink.  NECK:  Supple.  No JVD, no bruit.  LUNGS:  Clear to auscultation without rhonchi or rales.  CARDIOVASCULAR:  S1 and S2 normal.  There  was a soft systolic murmur.  There was no S3 gallop.  ABDOMEN:  Soft, obese. Bowel sounds are present, nontender.  EXTREMITIES:  No clubbing, cyanosis, or edema.  LABORATORY DATA:  EKG showed normal sinus rhythm with nonspecific T wave changes.  Two sets of CPKs were negative.  Troponin I was negative.  Hemoglobin 14.3, hematocrit 42.1, white count 11.2.  Sodium 138, potassium 3.7, bicarb 30, glucose 92, BUN 9, creatinine 0.7.  Cholesterol 199, HDL 42, LDL 129.  Amylase and lipase were within normal  range.  TSH was 2.54 which was within normal range.  BRIEF HOSPITAL COURSE:  The patient was admitted to the telemetry unit.  MI was ruled out by serial enzymes and EKG.  Patient underwent Persantine Cardiolite on September 05, 2000, and had resting pictures on October 23 which showed no evidence of reversible ischemia or scar with ejection fraction of 59%.  The patient had episode of epigastric pain associated with nausea. Proton pump inhibitors were continued with improvement in her symptoms. Serial amylase and lipase were drawn which were in normal range.  The patient had ultrasound of the abdomen in December 2000 which showed no evidence of ulcers.  The patient will be discharged home on the above medications and will be followed up by me in 7 to 10 days.  If she continues to have GI symptoms, she will be referred to GI and will proceed with GI workup as an outpatient. DD:  09/06/00 TD:  09/06/00 Job: [id number]

## 2011-04-13 ENCOUNTER — Encounter: Payer: Self-pay | Admitting: Occupational Therapy

## 2011-05-10 ENCOUNTER — Encounter: Payer: Self-pay | Admitting: Obstetrics and Gynecology

## 2011-05-10 ENCOUNTER — Other Ambulatory Visit: Payer: Self-pay | Admitting: Obstetrics and Gynecology

## 2011-05-10 ENCOUNTER — Other Ambulatory Visit (HOSPITAL_COMMUNITY)
Admission: RE | Admit: 2011-05-10 | Discharge: 2011-05-10 | Disposition: A | Discharge status: Home / Self Care | Payer: Self-pay | Source: Ambulatory Visit | Attending: Obstetrics and Gynecology | Admitting: Obstetrics and Gynecology

## 2011-05-10 DIAGNOSIS — N938 Other specified abnormal uterine and vaginal bleeding: Secondary | ICD-10-CM | POA: Insufficient documentation

## 2011-05-10 DIAGNOSIS — N949 Unspecified condition associated with female genital organs and menstrual cycle: Secondary | ICD-10-CM | POA: Insufficient documentation

## 2011-05-10 DIAGNOSIS — Z124 Encounter for screening for malignant neoplasm of cervix: Secondary | ICD-10-CM

## 2011-05-10 DIAGNOSIS — Z01419 Encounter for gynecological examination (general) (routine) without abnormal findings: Secondary | ICD-10-CM

## 2011-05-11 NOTE — Group Therapy Note (Signed)
NAME:  Angelica, Williams NO.:  [id number]  MEDICAL RECORD NO.:  [id number]           PATIENT TYPE:  A  LOCATION:  [facility]                   FACILITY:  [facility]  PHYSICIAN:  Argentina Donovan, MD        DATE OF BIRTH:  May 03, 1966  DATE OF SERVICE:  05/10/2011                                 CLINIC NOTE  HISTORY OF PRESENT ILLNESS:  The patient is a 45 year old African American female gravida 5, para 2-0-3-2, morbidly obese at 344 pounds 5 feet 7 inches tall who states for the past 2 years, she has been having extremely heavy periods, the longest she has been without a period over the past year is 19 days.  Periods cause cramps pain, and she bleeds sometimes for 10 to 12 days at a time.  She recently had a ultrasound in the MAU that was virtually normal.  She does present with some hirsutism.  Her last Pap smear was about 3 years ago.  The patient's blood pressure today is 144/91.  She took no medication except ibuprofen occasionally and she is not on iron therapy recently in the MAU her hemoglobin was 14.4 and we talked to her and we did an endometrial biopsy and a Pap smear.  The endometrial biopsy site, sound the uterus to 8 cm and has had a good biopsy result with one pass and Pap smear was taken.  I discussed with the patient the possibility of using a Mirena for an endometrial ablation.  We are going to have her come back for results in a couple weeks and also get a CBC and we are going to schedule her since it has been several years since she had a mammogram for scholarship mammogram.  So we filled out the papers today for Mirena IUD.  IMPRESSION:  Dysfunctional uterine bleeding.  Hemoglobin confirms that perception is not the same as reality for any amount of bleeding she is doing.  However I think that she does need some help to control this.          ______________________________ Argentina Donovan, MD    PR/MEDQ  D:  05/10/2011  T:  05/11/2011  Job:  [id number]

## 2011-05-26 ENCOUNTER — Other Ambulatory Visit: Payer: Self-pay | Admitting: Obstetrics and Gynecology

## 2011-05-26 DIAGNOSIS — Z1231 Encounter for screening mammogram for malignant neoplasm of breast: Secondary | ICD-10-CM

## 2011-06-08 ENCOUNTER — Ambulatory Visit (HOSPITAL_COMMUNITY): Payer: Self-pay | Attending: Obstetrics and Gynecology

## 2011-06-09 DIAGNOSIS — E7800 Pure hypercholesterolemia, unspecified: Secondary | ICD-10-CM

## 2011-06-09 DIAGNOSIS — I10 Essential (primary) hypertension: Secondary | ICD-10-CM

## 2011-06-09 DIAGNOSIS — Z86718 Personal history of other venous thrombosis and embolism: Secondary | ICD-10-CM

## 2011-06-09 DIAGNOSIS — N938 Other specified abnormal uterine and vaginal bleeding: Secondary | ICD-10-CM

## 2011-06-15 ENCOUNTER — Ambulatory Visit: Payer: Self-pay | Admitting: Obstetrics and Gynecology

## 2011-07-16 ENCOUNTER — Emergency Department (HOSPITAL_COMMUNITY)
Admission: EM | Admit: 2011-07-16 | Discharge: 2011-07-16 | Disposition: A | Discharge status: Home / Self Care | Payer: Self-pay | Attending: Emergency Medicine | Admitting: Emergency Medicine

## 2011-07-16 ENCOUNTER — Emergency Department (HOSPITAL_COMMUNITY): Payer: Self-pay

## 2011-07-16 DIAGNOSIS — R51 Headache: Secondary | ICD-10-CM | POA: Insufficient documentation

## 2011-07-16 DIAGNOSIS — R42 Dizziness and giddiness: Secondary | ICD-10-CM | POA: Insufficient documentation

## 2011-07-16 DIAGNOSIS — Z86718 Personal history of other venous thrombosis and embolism: Secondary | ICD-10-CM | POA: Insufficient documentation

## 2011-07-16 DIAGNOSIS — I10 Essential (primary) hypertension: Secondary | ICD-10-CM | POA: Insufficient documentation

## 2011-07-16 DIAGNOSIS — M79609 Pain in unspecified limb: Secondary | ICD-10-CM

## 2011-07-16 LAB — DIFFERENTIAL
Basophils Absolute: 0 10*3/uL (ref 0.0–0.1)
Basophils Relative: 0 % (ref 0–1)
Eosinophils Relative: 1 % (ref 0–5)
Lymphocytes Relative: 23 % (ref 12–46)

## 2011-07-16 LAB — CBC
HCT: 43.2 % (ref 36.0–46.0)
Platelets: 307 10*3/uL (ref 150–400)
RDW: 14.7 % (ref 11.5–15.5)
WBC: 11.2 10*3/uL — ABNORMAL HIGH (ref 4.0–10.5)

## 2011-07-16 LAB — COMPREHENSIVE METABOLIC PANEL
ALT: 13 U/L (ref 0–35)
AST: 20 U/L (ref 0–37)
Albumin: 3.1 g/dL — ABNORMAL LOW (ref 3.5–5.2)
Alkaline Phosphatase: 66 U/L (ref 39–117)
Glucose, Bld: 98 mg/dL (ref 70–99)
Potassium: 3.6 mEq/L (ref 3.5–5.1)
Sodium: 137 mEq/L (ref 135–145)
Total Protein: 7.7 g/dL (ref 6.0–8.3)

## 2011-07-16 LAB — D-DIMER, QUANTITATIVE: D-Dimer, Quant: 0.35 ug/mL-FEU (ref 0.00–0.48)

## 2011-07-16 LAB — APTT: aPTT: 30 seconds (ref 24–37)

## 2011-07-16 LAB — PROTIME-INR: INR: 1.03 (ref 0.00–1.49)

## 2011-08-23 ENCOUNTER — Telehealth: Payer: Self-pay | Admitting: *Deleted

## 2011-08-23 NOTE — Telephone Encounter (Signed)
Patient called and said that her cycles are still heavy and she needs medicine for the pain of cramps. Pt was seen in office in June and had endo bx. Pt was supposed to have followed up in clinic in August. I advised pt that she needs to make an appt to follow up in clinic and in the mean time she can use ibuprofen or tylenol for pain. Pt agrees with plan and was transferred to the front desk to make an appt.

## 2011-08-28 LAB — URINALYSIS, ROUTINE W REFLEX MICROSCOPIC
Bilirubin Urine: NEGATIVE
Hgb urine dipstick: NEGATIVE
Ketones, ur: NEGATIVE
Nitrite: NEGATIVE
Specific Gravity, Urine: 1.024
pH: 6

## 2011-08-28 LAB — BASIC METABOLIC PANEL
BUN: 9
CO2: 27
Chloride: 104
Glucose, Bld: 119 — ABNORMAL HIGH
Potassium: 3.8
Sodium: 136

## 2011-08-28 LAB — CBC
HCT: 42.4
Hemoglobin: 13.8
MCV: 81
Platelets: 295
RDW: 13.8

## 2011-08-28 LAB — D-DIMER, QUANTITATIVE: D-Dimer, Quant: 0.27

## 2011-08-28 LAB — DIFFERENTIAL
Basophils Absolute: 0.1
Eosinophils Absolute: 0.1
Eosinophils Relative: 2
Monocytes Absolute: 0.7

## 2011-08-28 LAB — POCT PREGNANCY, URINE: Operator id: 29008

## 2011-09-14 ENCOUNTER — Ambulatory Visit (INDEPENDENT_AMBULATORY_CARE_PROVIDER_SITE_OTHER): Payer: Self-pay | Admitting: Advanced Practice Midwife

## 2011-09-14 ENCOUNTER — Encounter: Payer: Self-pay | Admitting: Advanced Practice Midwife

## 2011-09-14 DIAGNOSIS — N946 Dysmenorrhea, unspecified: Secondary | ICD-10-CM

## 2011-09-14 DIAGNOSIS — N920 Excessive and frequent menstruation with regular cycle: Secondary | ICD-10-CM

## 2011-09-14 DIAGNOSIS — I10 Essential (primary) hypertension: Secondary | ICD-10-CM

## 2011-09-15 ENCOUNTER — Telehealth: Payer: Self-pay | Admitting: *Deleted

## 2011-09-15 DIAGNOSIS — N946 Dysmenorrhea, unspecified: Secondary | ICD-10-CM | POA: Insufficient documentation

## 2011-09-15 MED ORDER — KETOROLAC TROMETHAMINE 10 MG PO TABS: 10.0000 mg | ORAL_TABLET | Freq: Four times a day (QID) | ORAL | Status: AC | PRN

## 2011-09-15 NOTE — Telephone Encounter (Signed)
Message copied by Lynnell Dike on Fri Sep 15, 2011 11:35 AM ------      Message from: Mayra Neer P      Created: Thu Sep 14, 2011  6:12 PM      Regarding: Patient follow-up       Patient was seen in our office 09/14/11.  Elevated BP.  Will be scheduled for surgery consult in our office but needs to see a primary care provider for evaluation and treatment of BP.  Please call patient and help get into a primary care doctor so BP will be under control before she has surgery consult - as surgery will not be scheduled with BP elevated.      Thanks,      [employer]

## 2011-09-15 NOTE — Patient Instructions (Signed)
Hypertension As your heart beats, it forces blood through your arteries. This force is your blood pressure. If the pressure is too high, it is called hypertension (HTN) or high blood pressure. HTN is dangerous because you may have it and not know it. High blood pressure may mean that your heart has to work harder to pump blood. Your arteries may be narrow or stiff. The extra work puts you at risk for heart disease, stroke, and other problems.  Blood pressure consists of two numbers, a higher number over a lower, 110/72, for example. It is stated as "110 over 72." The ideal is below 120 for the top number (systolic) and under 80 for the bottom (diastolic). Write down your blood pressure today. You should pay close attention to your blood pressure if you have certain conditions such as:  Heart failure.   Prior heart attack.   Diabetes   Chronic kidney disease.   Prior stroke.   Multiple risk factors for heart disease.  To see if you have HTN, your blood pressure should be measured while you are seated with your arm held at the level of the heart. It should be measured at least twice. A one-time elevated blood pressure reading (especially in the Emergency Department) does not mean that you need treatment. There may be conditions in which the blood pressure is different between your right and left arms. It is important to see your caregiver soon for a recheck. Most people have essential hypertension which means that there is not a specific cause. This type of high blood pressure may be lowered by changing lifestyle factors such as:  Stress.   Smoking.   Lack of exercise.   Excessive weight.   Drug/tobacco/alcohol use.   Eating less salt.  Most people do not have symptoms from high blood pressure until it has caused damage to the body. Effective treatment can often prevent, delay or reduce that damage. TREATMENT  When a cause has been identified, treatment for high blood pressure is  directed at the cause. There are a large number of medications to treat HTN. These fall into several categories, and your caregiver will help you select the medicines that are best for you. Medications may have side effects. You should review side effects with your caregiver. If your blood pressure stays high after you have made lifestyle changes or started on medicines,   Your medication(s) may need to be changed.   Other problems may need to be addressed.   Be certain you understand your prescriptions, and know how and when to take your medicine.   Be sure to follow up with your caregiver within the time frame advised (usually within two weeks) to have your blood pressure rechecked and to review your medications.   If you are taking more than one medicine to lower your blood pressure, make sure you know how and at what times they should be taken. Taking two medicines at the same time can result in blood pressure that is too low.  SEEK IMMEDIATE MEDICAL CARE IF:  You develop a severe headache, blurred or changing vision, or confusion.   You have unusual weakness or numbness, or a faint feeling.   You have severe chest or abdominal pain, vomiting, or breathing problems.  MAKE SURE YOU:   Understand these instructions.   Will watch your condition.   Will get help right away if you are not doing well or get worse.  Document Released: 10/30/2005 Document Revised: 07/12/2011 Document Reviewed:   06/19/2008 [employer] Patient Information 2012 [place], [place].Menorrhagia Dysfunctional uterine bleeding is different from a normal menstrual period. When periods are heavy or there is more bleeding than is usual for you, it is called menorrhagia. It may be caused by hormonal imbalance, or physical, metabolic, or other problems. Examination is necessary in order that your caregiver may treat treatable causes. If this is a continuing problem, a D&C may be needed. That means that the cervix (the opening of  the uterus or womb) is dilated (stretched larger) and the lining of the uterus is scraped out. The tissue scraped out is then examined under a microscope by a specialist (pathologist) to make sure there is nothing of concern that needs further or more extensive treatment. HOME CARE INSTRUCTIONS   If medications were prescribed, take exactly as directed. Do not change or switch medications without consulting your caregiver.   Long term heavy bleeding may result in iron deficiency. Your caregiver may have prescribed iron pills. They help replace the iron your body lost from heavy bleeding. Take exactly as directed. Iron may cause constipation. If this becomes a problem, increase the bran, fruits, and roughage in your diet.   Do not take aspirin or medicines that contain aspirin one week before or during your menstrual period. Aspirin may make the bleeding worse.   If you need to change your sanitary pad or tampon more than once every 2 hours, stay in bed and rest as much as possible until the bleeding stops.   Eat well-balanced meals. Eat foods high in iron. Examples are leafy green vegetables, meat, liver, eggs, and whole grain breads and cereals. Do not try to lose weight until the abnormal bleeding has stopped and your blood iron level is back to normal.  SEEK MEDICAL CARE IF:   You need to change your sanitary pad or tampon more than once an hour.   You develop nausea (feeling sick to your stomach) and vomiting, dizziness, or diarrhea while you are taking your medicine.   You have any problems that may be related to the medicine you are taking.  SEEK IMMEDIATE MEDICAL CARE IF:   You have a fever.   You develop chills.   You develop severe bleeding or start to pass blood clots.   You feel dizzy or faint.  MAKE SURE YOU:   Understand these instructions.   Will watch your condition.   Will get help right away if you are not doing well or get worse.  Document Released: 10/30/2005  Document Revised: 07/12/2011 Document Reviewed: 06/19/2008 [facility] Patient Information 2012 [place], [place].

## 2011-09-15 NOTE — Progress Notes (Signed)
  Subjective:    Patient ID: [name], female    DOB: 04-07-1966, 45 y.o.   MRN: [id number]  HPI [name] is a 45 y.o. year old [id number] female who presents to OP clinic reporting a continuation of Menorrhagia w/ regular cycles. She is not currently bleeding, but reports heavy cycles lasting ~ 11/2-2 weeks. Patient's last menstrual period was 08/21/2011. She has a Hx of DVT. She was see in the OP clinic in 6/12 by Dr. Okey Dupre for the same problem. EBX and [place] were normal. Pt was offered Mirena or Ablation and instructed to F/U in OP Clinic if interested in either choice.   She has a Hx of labile BPs, but does not have a regular PCP (goes to Urgent Care).  Past Medical History  Diagnosis Date  . Hypertension   . Deep vein thrombosis (DVT)    Past Surgical History  Procedure Date  . Carpal tunnel release   . Knee surgery     left knee  . Cesarean section     2 previous c-sections  . Tubal ligation    Family History  Problem Relation Age of Onset  . Cancer Mother     breast  . Hypertension Mother   . Hypertension Father   . Diabetes Paternal Aunt   . Diabetes Paternal Uncle   . Cancer Maternal Grandmother     breast   Review of Systems  Constitutional: Negative for fever and chills.  Gastrointestinal: Negative for nausea, vomiting, diarrhea, constipation and abdominal distention.  Genitourinary: Positive for pelvic pain (at onset of menses). Negative for vaginal bleeding and vaginal discharge.  Skin: Negative for pallor.  Neurological: Negative for dizziness and syncope.      Objective:   Physical Exam  Constitutional: She is oriented to person, place, and time. She appears well-developed and well-nourished. She appears distressed.  Eyes: Pupils are equal, round, and reactive to light.  Cardiovascular: Normal rate.   Pulmonary/Chest: Effort normal.  Abdominal: Soft. She exhibits no distension. There is no tenderness.  Genitourinary:       Deferred  Neurological:  She is alert and oriented to person, place, and time.  Skin: Skin is warm and dry.  Psychiatric: She has a normal mood and affect.   BP 164/93  Pulse 100  Temp(Src) 97.5 F (36.4 C) (Oral)  Ht 5\' 7"  (1.702 m)  Wt 161.299 kg (355 lb 9.6 oz)  BMI 55.69 kg/m2  LMP 08/21/2011     Assessment & Plan:  Assessment: 1. Menorrhagia w/ regular cycles 2. Uncontrolled HTN 3. Morbid Obesity 4. Dysmenorrhea  Plan: 1. Refer to [facility] for HTN 2. Per consult w/ Dr. Marice Potter, cannot Rx any meds for menorrhagia due to DVT Hx and Uncontrolled HTN 3. Rx Toradol 4. Discussed Mirena vs ablation. Pt not interested, wants hysterectomy. Reiterated R/B/I of each, encouraged lower intervention Tx. Pt stills declines Mirena. 5. F/U in 1 month w/ surgeon or MAU PRN for heavy bleeding 6. CBC  [name] 09/15/2011 11:36 AM

## 2011-09-15 NOTE — Telephone Encounter (Signed)
Telephoned patient at home #. Need to advised pt per [place], CNM that we can not give her any meds for bleeding due to blood pressure. A pain med has been called to her pharmacy. Pt needs to be referred to a Dr for blood pressure before surgery can be scheduled. Pt does not have insurance. Also needs to have appointment in one month.

## 2011-09-19 NOTE — Telephone Encounter (Signed)
Telephoned pt at home number. Advised pt that she had pain med at pharmacy. Pt states was making appointment with a primary Dr for blood pressure management. Pt states will make a follow up appt with clinic once blood pressure was under control.

## 2012-01-28 ENCOUNTER — Other Ambulatory Visit: Payer: Self-pay

## 2012-01-28 ENCOUNTER — Emergency Department (HOSPITAL_COMMUNITY)
Admission: EM | Admit: 2012-01-28 | Discharge: 2012-01-28 | Disposition: A | Discharge status: Home / Self Care | Payer: Self-pay | Attending: Emergency Medicine | Admitting: Emergency Medicine

## 2012-01-28 ENCOUNTER — Encounter (HOSPITAL_COMMUNITY): Payer: Self-pay

## 2012-01-28 ENCOUNTER — Emergency Department (INDEPENDENT_AMBULATORY_CARE_PROVIDER_SITE_OTHER)
Admission: EM | Admit: 2012-01-28 | Discharge: 2012-01-28 | Disposition: A | Discharge status: Nursing Home (Includes ICF) | Payer: Self-pay | Source: Home / Self Care

## 2012-01-28 ENCOUNTER — Encounter (HOSPITAL_COMMUNITY): Payer: Self-pay | Admitting: Emergency Medicine

## 2012-01-28 ENCOUNTER — Emergency Department (HOSPITAL_COMMUNITY): Payer: Self-pay

## 2012-01-28 DIAGNOSIS — R0789 Other chest pain: Secondary | ICD-10-CM

## 2012-01-28 DIAGNOSIS — J40 Bronchitis, not specified as acute or chronic: Secondary | ICD-10-CM | POA: Insufficient documentation

## 2012-01-28 DIAGNOSIS — R42 Dizziness and giddiness: Secondary | ICD-10-CM

## 2012-01-28 DIAGNOSIS — R51 Headache: Secondary | ICD-10-CM

## 2012-01-28 DIAGNOSIS — Z86718 Personal history of other venous thrombosis and embolism: Secondary | ICD-10-CM | POA: Insufficient documentation

## 2012-01-28 DIAGNOSIS — I10 Essential (primary) hypertension: Secondary | ICD-10-CM | POA: Insufficient documentation

## 2012-01-28 LAB — BASIC METABOLIC PANEL
BUN: 15 mg/dL (ref 6–23)
CO2: 28 mEq/L (ref 19–32)
Calcium: 9.6 mg/dL (ref 8.4–10.5)
Creatinine, Ser: 0.77 mg/dL (ref 0.50–1.10)
Glucose, Bld: 103 mg/dL — ABNORMAL HIGH (ref 70–99)

## 2012-01-28 LAB — GLUCOSE, CAPILLARY: Glucose-Capillary: 97 mg/dL (ref 70–99)

## 2012-01-28 LAB — CBC
MCH: 26.2 pg (ref 26.0–34.0)
MCV: 82.8 fL (ref 78.0–100.0)
Platelets: 283 10*3/uL (ref 150–400)
RDW: 14.7 % (ref 11.5–15.5)

## 2012-01-28 MED ORDER — BENZONATATE 100 MG PO CAPS: 100.0000 mg | ORAL_CAPSULE | Freq: Three times a day (TID) | ORAL | Status: AC

## 2012-01-28 MED ORDER — NAPROXEN 500 MG PO TABS: 500.0000 mg | ORAL_TABLET | Freq: Two times a day (BID) | ORAL | Status: DC

## 2012-01-28 MED ORDER — NAPROXEN 500 MG PO TABS: 500.0000 mg | ORAL_TABLET | Freq: Two times a day (BID) | ORAL | Status: AC

## 2012-01-28 NOTE — ED Provider Notes (Signed)
History     CSN: [id number]  Arrival date & time 01/28/12  1036   First MD Initiated Contact with Patient 01/28/12 1059      Chief Complaint  Patient presents with  . URI    HPI Pt has been coughing since Thursday.  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  Pt states the symptoms have gotten worse so she went to a UC and was sent to the ED from there.  Pt has had a persistent dry cough.  She has had constant chest pressure as well. It has not resolved. It does not increase with breathing.  NO fever.  She has noticed intermittent leg swelling but none now.  She has history of prior DVT but is not currently on an anticoagulation.   Past Medical History  Diagnosis Date  . Hypertension   . Deep vein thrombosis (DVT)     Past Surgical History  Procedure Date  . Carpal tunnel release   . Knee surgery     left knee  . Cesarean section     2 previous c-sections  . Tubal ligation     Family History  Problem Relation Age of Onset  . Cancer Mother     breast  . Hypertension Mother   . Hypertension Father   . Diabetes Paternal Aunt   . Diabetes Paternal Uncle   . Cancer Maternal Grandmother     breast    History  Substance Use Topics  . Smoking status: Never Smoker   . Smokeless tobacco: Never Used  . Alcohol Use: No    OB History    Grav Para Term Preterm Abortions TAB SAB Ect Mult Living   5 2 1 1 3  3   2       Review of Systems  All other systems reviewed and are negative.    Allergies  Darvocet; Percocet; and Vicodin  Home Medications   Current Outpatient Rx  Name Route Sig Dispense Refill  . IBUPROFEN 800 MG PO TABS Oral Take 800 mg by mouth every 8 (eight) hours as needed. For pain      BP 156/92  Pulse 77  Temp(Src) 98.7 F (37.1 C) (Oral)  Resp 16  SpO2 100%  Physical Exam  Nursing note and vitals reviewed. Constitutional: She appears well-developed and well-nourished. No distress.       Morbidly obese  HENT:  Head:  Normocephalic and atraumatic.  Right Ear: External ear normal.  Left Ear: External ear normal.  Eyes: Conjunctivae are normal. Right eye exhibits no discharge. Left eye exhibits no discharge. No scleral icterus.  Neck: Neck supple. No tracheal deviation present.  Cardiovascular: Normal rate, regular rhythm and intact distal pulses.   Pulmonary/Chest: Effort normal and breath sounds normal. No stridor. No respiratory distress. She has no wheezes. She has no rales.  Abdominal: Soft. Bowel sounds are normal. She exhibits no distension. There is no tenderness. There is no rebound and no guarding.  Musculoskeletal: She exhibits no edema and no tenderness.       No cords palpable, negative Homans sign  Neurological: She is alert. She has normal strength. No sensory deficit. Cranial nerve deficit:  no gross defecits noted. She exhibits normal muscle tone. She displays no seizure activity. Coordination normal.  Skin: Skin is warm and dry. No rash noted.  Psychiatric: She has a normal mood and affect.    ED Course  Procedures (including critical care time)  Date: 01/28/2012  Rate:  79  Rhythm: normal sinus rhythm  QRS Axis: normal  Intervals: normal  ST/T Wave abnormalities: normal  Conduction Disutrbances:none  Narrative Interpretation:   Old EKG Reviewed: unchanged   Labs Reviewed  BASIC METABOLIC PANEL - Abnormal; Notable for the following:    Glucose, Bld 103 (*)    All other components within normal limits  CBC - Abnormal; Notable for the following:    RBC 5.42 (*)    All other components within normal limits  PRO B NATRIURETIC PEPTIDE  D-DIMER, QUANTITATIVE   Dg Chest 2 View  01/28/2012  *RADIOLOGY REPORT*  Clinical Data: Chest pain, shortness of breath  CHEST - 2 VIEW  Comparison: 07/16/2011  Findings: Cardiomediastinal silhouette is stable.  No acute infiltrate or pleural effusion.  No pulmonary edema.  Bony thorax is stable.  IMPRESSION: No active disease.  Original Report  Authenticated By: Natasha Mead, M.D.     1. Bronchitis       MDM  I suspect that the patient's pain is related to her cough and upper respiratory infection. She has a negative d-dimer and a normal BNP. Her x-ray is also unremarkable. I doubt pneumonia, congestive heart failure, pulmonary embolism. Her symptoms are not suggestive of an acute coronary syndrome. Patient be discharged home with medications to help with her cough/bronchitid which is most likely viral in nature        Celene Kras, MD 01/28/12 1242

## 2012-01-28 NOTE — Discharge Instructions (Signed)
Antibiotic Nonuse  Your caregiver felt that the infection or problem was not one that would be helped with an antibiotic. Infections may be caused by viruses or bacteria. Only a caregiver can tell which one of these is the likely cause of an illness. A cold is the most common cause of infection in both adults and children. A cold is a virus. Antibiotic treatment will have no effect on a viral infection. Viruses can lead to many lost days of work caring for sick children and many missed days of school. Children may catch as many as 10 "colds" or "flus" per year during which they can be tearful, cranky, and uncomfortable. The goal of treating a virus is aimed at keeping the ill person comfortable. Antibiotics are medications used to help the body fight bacterial infections. There are relatively few types of bacteria that cause infections but there are hundreds of viruses. While both viruses and bacteria cause infection they are very different types of germs. A viral infection will typically go away by itself within 7 to 10 days. Bacterial infections may spread or get worse without antibiotic treatment. Examples of bacterial infections are:  Sore throats (like strep throat or tonsillitis).   Infection in the lung (pneumonia).   Ear and skin infections.  Examples of viral infections are:  Colds or flus.   Most coughs and bronchitis.   Sore throats not caused by Strep.   Runny noses.  It is often best not to take an antibiotic when a viral infection is the cause of the problem. Antibiotics can kill off the helpful bacteria that we have inside our body and allow harmful bacteria to start growing. Antibiotics can cause side effects such as allergies, nausea, and diarrhea without helping to improve the symptoms of the viral infection. Additionally, repeated uses of antibiotics can cause bacteria inside of our body to become resistant. That resistance can be passed onto harmful bacterial. The next time  you have an infection it may be harder to treat if antibiotics are used when they are not needed. Not treating with antibiotics allows our own immune system to develop and take care of infections more efficiently. Also, antibiotics will work better for us when they are prescribed for bacterial infections. Treatments for a child that is ill may include:  Give extra fluids throughout the day to stay hydrated.   Get plenty of rest.   Only give your child over-the-counter or prescription medicines for pain, discomfort, or fever as directed by your caregiver.   The use of a cool mist humidifier may help stuffy noses.   Cold medications if suggested by your caregiver.  Your caregiver may decide to start you on an antibiotic if:  The problem you were seen for today continues for a longer length of time than expected.   You develop a secondary bacterial infection.  SEEK MEDICAL CARE IF:  Fever lasts longer than 5 days.   Symptoms continue to get worse after 5 to 7 days or become severe.   Difficulty in breathing develops.   Signs of dehydration develop (poor drinking, rare urinating, dark colored urine).   Changes in behavior or worsening tiredness (listlessness or lethargy).  Document Released: 01/08/2002 Document Revised: 10/19/2011 Document Reviewed: 07/07/2009 ExitCare Patient Information 2012 [employer].Bronchitis Bronchitis is a problem of the air tubes leading to your lungs. This problem makes it hard for air to get in and out of the lungs. You may cough a lot because your air tubes are   narrow. Going without care can cause lasting (chronic) bronchitis. HOME CARE   Drink enough fluids to keep your pee (urine) clear or pale yellow.   Use a cool mist humidifier.   Quit smoking if you smoke. If you keep smoking, the bronchitis might not get better.   Only take medicine as told by your doctor.  GET HELP RIGHT AWAY IF:   Coughing keeps you awake.   You start to wheeze.    You become more sick or weak.   You have a hard time breathing or get short of breath.   You cough up blood.   Coughing lasts more than 2 weeks.   You have a fever.   Your baby is older than 3 months with a rectal temperature of 102 F (38.9 C) or higher.   Your baby is 3 months old or younger with a rectal temperature of 100.4 F (38 C) or higher.  MAKE SURE YOU:  Understand these instructions.   Will watch your condition.   Will get help right away if you are not doing well or get worse.  Document Released: 04/17/2008 Document Revised: 10/19/2011 Document Reviewed: 10/01/2009 ExitCare Patient Information 2012 [employer]. 

## 2012-01-28 NOTE — ED Notes (Signed)
Patient presents from urgent care for further evaluation of chest pressure that has been present for 2 days.  Patient reports difficulty breathing when laying flat with dizziness and headache.  Patient reporting chest pressure 5/10 which is worse upon cough.

## 2012-01-28 NOTE — ED Notes (Signed)
Report given to [facility], triage nurse. Pt stable. Transferred by shuttle

## 2012-01-28 NOTE — ED Notes (Signed)
Pt. Stated, i went to UC for bronchitis and then they sent me hear for chest pain. Pt. Stated, the pain is jsut pressure.

## 2012-01-28 NOTE — ED Notes (Signed)
Pt states she was coughing, feeling bad  5 days ago, work sent her to their PA who dx her with bronchitis but did not prescribe anything. States she has chest pressure/pain with coughing and different positions she is in. Also states she she is tired has a headache, dizziness . Denies n/v/d.

## 2012-01-28 NOTE — ED Provider Notes (Signed)
Medical screening examination/treatment/procedure(s) were performed by non-physician practitioner and as supervising physician I was immediately available for consultation/collaboration.  Lavert Matousek   Tilly Pernice, MD 01/28/12 1115 

## 2012-01-28 NOTE — ED Provider Notes (Signed)
History     CSN: [id number]  Arrival date & time 01/28/12  5621   None     Chief Complaint  Patient presents with  . Chest Pain    chest pressure/pain, headache, dizziness, weakness    (Consider location/radiation/quality/duration/timing/severity/associated sxs/prior treatment) HPI Comments: Pt with long standing sinus congestion.  Began coughing and "feeling bad" 3 days ago.  Seen by a nurse at work, told probably has bronchitis from her sinus drainage.  Coughing worse, more frequent 2 days ago- cough is dry.  Also with chest pressure for 2 days; worst when lying flat.  Has been sleeping sitting up in a chair for 2 days, normally sleeps with 2 pillows.  Frequently has LE edema, especially after working all day; none today.  Also c/o headache for 2 days, worsening.  Also dizzy for 2-3 days, worsening; assoc with intermittent blurry vision- none at this time.    Patient is a 46 y.o. female presenting with chest pain. The history is provided by the patient.  Chest Pain The chest pain began 2 days ago. Chest pain occurs constantly. The chest pain is worsening. The pain is associated with coughing (worse when lying flat). The severity of the pain is moderate. The quality of the pain is described as pressure-like. The pain does not radiate. Chest pain is worsened by certain positions. Primary symptoms include cough and dizziness. Pertinent negatives for primary symptoms include no fever, no shortness of breath, no wheezing, no palpitations, no abdominal pain, no nausea and no vomiting.  The cough began 2 days ago. The cough is non-productive and dry.  She describes the dizziness as lightheadedness. The dizziness began 2 to 5 days ago. The dizziness has been unchanged since its onset. Dizziness also occurs with blurred vision. Dizziness does not occur with nausea or vomiting.  Her past medical history is significant for DVT and hypertension.     Past Medical History  Diagnosis Date  .  Hypertension   . Deep vein thrombosis (DVT)     Past Surgical History  Procedure Date  . Carpal tunnel release   . Knee surgery     left knee  . Cesarean section     2 previous c-sections  . Tubal ligation     Family History  Problem Relation Age of Onset  . Cancer Mother     breast  . Hypertension Mother   . Hypertension Father   . Diabetes Paternal Aunt   . Diabetes Paternal Uncle   . Cancer Maternal Grandmother     breast    History  Substance Use Topics  . Smoking status: Never Smoker   . Smokeless tobacco: Never Used  . Alcohol Use: No    OB History    Grav Para Term Preterm Abortions TAB SAB Ect Mult Living   5 2 1 1 3  3   2       Review of Systems  Constitutional: Positive for activity change. Negative for fever and chills.  HENT: Positive for sinus pressure.   Eyes: Positive for blurred vision and visual disturbance.  Respiratory: Positive for cough. Negative for shortness of breath and wheezing.   Cardiovascular: Positive for chest pain and leg swelling. Negative for palpitations.  Gastrointestinal: Negative for nausea, vomiting and abdominal pain.  Neurological: Positive for dizziness.    Allergies  Darvocet; Percocet; and Vicodin  Home Medications   Current Outpatient Rx  Name Route Sig Dispense Refill  . IBUPROFEN 800 MG PO TABS Oral Take  800 mg by mouth every 8 (eight) hours as needed.        BP 169/99  Pulse 82  Temp(Src) 98.7 F (37.1 C) (Oral)  Resp 19  SpO2 95%  Physical Exam  Constitutional: She appears well-developed and well-nourished.       Uncomfortable appearing  HENT:  Right Ear: Hearing, tympanic membrane and ear canal normal.  Left Ear: Tympanic membrane, external ear and ear canal normal.  Nose: Mucosal edema present.  Mouth/Throat: Oropharynx is clear and moist.  Cardiovascular: Normal rate and regular rhythm.   Pulmonary/Chest: Effort normal and breath sounds normal.       No coughing observed during exam.      ED Course  Procedures (including critical care time)   Labs Reviewed  GLUCOSE, CAPILLARY   No results found.   1. Chest pressure   2. Headache   3. Dizziness       MDM  Cap glucose is 97.  Discussed pt with Dr. Ladon Applebaum.  EKG stable, but pt has unexplained chest pressure and a myriad of other c/o.  Transfer to ED.          Cathlyn Parsons, NP 01/28/12 1030

## 2013-09-22 ENCOUNTER — Other Ambulatory Visit (HOSPITAL_COMMUNITY): Payer: Self-pay | Admitting: Family Medicine

## 2013-09-22 DIAGNOSIS — Z1231 Encounter for screening mammogram for malignant neoplasm of breast: Secondary | ICD-10-CM

## 2013-09-23 ENCOUNTER — Ambulatory Visit (HOSPITAL_COMMUNITY): Payer: Self-pay | Attending: Family Medicine

## 2013-11-13 HISTORY — PX: GASTRIC BYPASS: SHX52

## 2013-11-20 ENCOUNTER — Other Ambulatory Visit: Payer: Self-pay | Admitting: Obstetrics and Gynecology

## 2013-11-20 DIAGNOSIS — Z1231 Encounter for screening mammogram for malignant neoplasm of breast: Secondary | ICD-10-CM

## 2013-11-27 ENCOUNTER — Encounter: Payer: Self-pay | Admitting: *Deleted

## 2013-11-27 ENCOUNTER — Encounter: Payer: 59 | Attending: Family Medicine | Admitting: *Deleted

## 2013-11-27 DIAGNOSIS — E669 Obesity, unspecified: Secondary | ICD-10-CM | POA: Insufficient documentation

## 2013-11-27 DIAGNOSIS — Z6841 Body Mass Index (BMI) 40.0 and over, adult: Secondary | ICD-10-CM | POA: Insufficient documentation

## 2013-11-27 DIAGNOSIS — Z713 Dietary counseling and surveillance: Secondary | ICD-10-CM | POA: Insufficient documentation

## 2013-11-27 NOTE — Progress Notes (Signed)
Medical Nutrition Therapy:  Appt start time: 1630 end time:  1730.  Assessment:  Patient here today for weight management. She reports that she knows how to eat healthy, but lacks motivation. She lost about 60 pounds on a cleanse type diet several years ago, but regained the weight plus some. She reports that since her husband passed and her kids not living at home, she lacks motivation to cook. She eats out most days at [employer] or [employer]. She also drinks a lot of sweet tea and soda. She states that she needs someone to report to, in order to keep her accountable. She also attended an information session for bariatric surgery, but wants to get started making healthy changes now.   MEDICATIONS: Reviewed   DIETARY INTAKE:   Usual eating pattern includes 3 meals and 1-2 snacks per day.  24-hr recall:  B ( AM): Oatmeal OR egg  Snk ( AM): None  L ( PM): [employer] (burger, fries, sweet tea) OR [employer] cafeteria (chicken livers/steak, rice with gravy, sweet tea) OR leftovers Snk ( PM): Chips, junk food D ( PM): McDonald's or other fast food OR Chicken/Meatloaf, baked potato, salad Snk ( PM): Same Beverages: Soda, sweet tea, water  Usual physical activity: None  Estimated energy needs: 1500 calories 188 g carbohydrates 94 g protein 42 g fat  Progress Towards Goal(s):  In progress.   Nutritional Diagnosis:  Flanagan-3.3 Overweight/obesity As related to high intake of fast food and physical inactivity.  As evidenced by BMI >50.    Intervention:  Nutrition counseling. We discussed strategies for weight loss, including balancing nutrients (carbs, protein, fat), portion control, healthy snacks, and exercise.   Goals:  1. 1 pound weight loss per week.  2. Cook at least 3-4 day, make enough portions to make 3 meals.  3. Choose healthier options at restaurants and monitor portion size.  4. Limit intake of sweetened drinks.   Handouts given during visit include:  Weight loss tips  Meal plan  card  Monitoring/Evaluation:  Dietary intake, exercise, and body weight in 1 month(s).

## 2013-12-10 ENCOUNTER — Ambulatory Visit (INDEPENDENT_AMBULATORY_CARE_PROVIDER_SITE_OTHER): Payer: Self-pay | Admitting: Surgery

## 2013-12-12 ENCOUNTER — Ambulatory Visit: Payer: Self-pay

## 2013-12-29 ENCOUNTER — Ambulatory Visit: Payer: Self-pay

## 2014-01-12 ENCOUNTER — Ambulatory Visit: Payer: Self-pay

## 2014-01-26 ENCOUNTER — Ambulatory Visit
Admission: RE | Admit: 2014-01-26 | Discharge: 2014-01-26 | Disposition: A | Discharge status: Home / Self Care | Payer: 59 | Source: Ambulatory Visit | Attending: Obstetrics and Gynecology | Admitting: Obstetrics and Gynecology

## 2014-01-26 DIAGNOSIS — Z1231 Encounter for screening mammogram for malignant neoplasm of breast: Secondary | ICD-10-CM

## 2014-01-29 ENCOUNTER — Ambulatory Visit: Payer: Self-pay | Admitting: Dietician

## 2014-01-30 ENCOUNTER — Ambulatory Visit (INDEPENDENT_AMBULATORY_CARE_PROVIDER_SITE_OTHER): Payer: Self-pay | Admitting: Surgery

## 2014-03-11 ENCOUNTER — Ambulatory Visit
Admission: RE | Admit: 2014-03-11 | Discharge: 2014-03-11 | Disposition: A | Discharge status: Home / Self Care | Payer: 59 | Source: Ambulatory Visit | Attending: Family Medicine | Admitting: Family Medicine

## 2014-03-11 ENCOUNTER — Other Ambulatory Visit: Payer: Self-pay | Admitting: Family Medicine

## 2014-03-11 DIAGNOSIS — R059 Cough, unspecified: Secondary | ICD-10-CM

## 2014-03-11 DIAGNOSIS — R0602 Shortness of breath: Secondary | ICD-10-CM

## 2014-03-11 DIAGNOSIS — R05 Cough: Secondary | ICD-10-CM

## 2014-06-13 HISTORY — PX: GASTRIC BYPASS: SHX52

## 2014-09-14 ENCOUNTER — Encounter: Payer: Self-pay | Admitting: *Deleted

## 2014-12-07 ENCOUNTER — Ambulatory Visit: Payer: Self-pay | Admitting: Podiatry

## 2015-07-08 ENCOUNTER — Other Ambulatory Visit: Payer: Self-pay

## 2015-07-08 DIAGNOSIS — Z1231 Encounter for screening mammogram for malignant neoplasm of breast: Secondary | ICD-10-CM

## 2015-08-17 ENCOUNTER — Ambulatory Visit: Payer: Self-pay

## 2015-08-24 ENCOUNTER — Ambulatory Visit
Admission: RE | Admit: 2015-08-24 | Discharge: 2015-08-24 | Disposition: A | Discharge status: Home / Self Care | Payer: 59 | Source: Ambulatory Visit | Attending: Family Medicine | Admitting: Family Medicine

## 2015-08-24 ENCOUNTER — Other Ambulatory Visit: Payer: Self-pay | Admitting: Family Medicine

## 2015-08-24 DIAGNOSIS — M79604 Pain in right leg: Secondary | ICD-10-CM

## 2015-08-24 DIAGNOSIS — R609 Edema, unspecified: Secondary | ICD-10-CM

## 2015-12-29 ENCOUNTER — Encounter: Payer: Self-pay | Admitting: Podiatry

## 2015-12-29 NOTE — Progress Notes (Signed)
Patient ID: [name], female   DOB: [birth year], 50 y.o.   MRN: [id number]   NO SHOW

## 2016-03-13 ENCOUNTER — Inpatient Hospital Stay (HOSPITAL_COMMUNITY)
Admission: AD | Admit: 2016-03-13 | Discharge: 2016-03-13 | Discharge status: Against Medical Advice | Payer: 59 | Source: Ambulatory Visit | Attending: Obstetrics and Gynecology | Admitting: Obstetrics and Gynecology

## 2016-03-13 DIAGNOSIS — N939 Abnormal uterine and vaginal bleeding, unspecified: Secondary | ICD-10-CM | POA: Insufficient documentation

## 2016-03-13 NOTE — MAU Note (Signed)
Pt states hx of anemia since August 2015 with gastric bypass surgery.Last normal period was in February. Heavy bleeding since April 30, using 2 pads an hour. Pt having nausea, no vomiting. Pt has light headness. abd cramping.

## 2016-03-13 NOTE — MAU Note (Signed)
Patient states she misunderstood instructions from clinic and that she left work to come to MAU right away. States her bleeding has been off and on and feels that she needs to be evaluated, but can wait and be evaluated in clinic appt and not in ER setting. Patient left in stable condition.

## 2016-03-16 ENCOUNTER — Encounter: Payer: Self-pay | Admitting: Obstetrics & Gynecology

## 2016-03-16 ENCOUNTER — Ambulatory Visit (INDEPENDENT_AMBULATORY_CARE_PROVIDER_SITE_OTHER): Payer: 59 | Admitting: Obstetrics & Gynecology

## 2016-03-16 VITALS — BP 166/101 | HR 75

## 2016-03-16 DIAGNOSIS — N938 Other specified abnormal uterine and vaginal bleeding: Secondary | ICD-10-CM | POA: Diagnosis not present

## 2016-03-16 LAB — CBC
HCT: 35.2 % (ref 35.0–45.0)
Hemoglobin: 10.5 g/dL — ABNORMAL LOW (ref 11.7–15.5)
MCH: 22 pg — AB (ref 27.0–33.0)
MCHC: 29.8 g/dL — AB (ref 32.0–36.0)
MCV: 73.6 fL — AB (ref 80.0–100.0)
MPV: 9.7 fL (ref 7.5–12.5)
PLATELETS: 365 10*3/uL (ref 140–400)
RBC: 4.78 MIL/uL (ref 3.80–5.10)
RDW: 16.9 % — AB (ref 11.0–15.0)
WBC: 7.4 10*3/uL (ref 3.8–10.8)

## 2016-03-16 NOTE — Patient Instructions (Signed)
Levonorgestrel intrauterine device (IUD) What is this medicine? LEVONORGESTREL IUD (LEE voe nor jes trel) is a contraceptive (birth control) device. The device is placed inside the uterus by a healthcare professional. It is used to prevent pregnancy and can also be used to treat heavy bleeding that occurs during your period. Depending on the device, it can be used for 3 to 5 years. This medicine may be used for other purposes; ask your health care provider or pharmacist if you have questions. What should I tell my health care provider before I take this medicine? They need to know if you have any of these conditions: -abnormal Pap smear -cancer of the breast, uterus, or cervix -diabetes -endometritis -genital or pelvic infection now or in the past -have more than one sexual partner or your partner has more than one partner -heart disease -history of an ectopic or tubal pregnancy -immune system problems -IUD in place -liver disease or tumor -problems with blood clots or take blood-thinners -use intravenous drugs -uterus of unusual shape -vaginal bleeding that has not been explained -an unusual or allergic reaction to levonorgestrel, other hormones, silicone, or polyethylene, medicines, foods, dyes, or preservatives -pregnant or trying to get pregnant -breast-feeding How should I use this medicine? This device is placed inside the uterus by a health care professional. Talk to your pediatrician regarding the use of this medicine in children. Special care may be needed. Overdosage: If you think you have taken too much of this medicine contact a poison control center or emergency room at once. NOTE: This medicine is only for you. Do not share this medicine with others. What if I miss a dose? This does not apply. What may interact with this medicine? Do not take this medicine with any of the following medications: -amprenavir -bosentan -fosamprenavir This medicine may also interact with  the following medications: -aprepitant -barbiturate medicines for inducing sleep or treating seizures -bexarotene -griseofulvin -medicines to treat seizures like carbamazepine, ethotoin, felbamate, oxcarbazepine, phenytoin, topiramate -modafinil -pioglitazone -rifabutin -rifampin -rifapentine -some medicines to treat HIV infection like atazanavir, indinavir, lopinavir, nelfinavir, tipranavir, ritonavir -St. John's wort -warfarin This list may not describe all possible interactions. Give your health care provider a list of all the medicines, herbs, non-prescription drugs, or dietary supplements you use. Also tell them if you smoke, drink alcohol, or use illegal drugs. Some items may interact with your medicine. What should I watch for while using this medicine? Visit your doctor or health care professional for regular check ups. See your doctor if you or your partner has sexual contact with others, becomes HIV positive, or gets a sexual transmitted disease. This product does not protect you against HIV infection (AIDS) or other sexually transmitted diseases. You can check the placement of the IUD yourself by reaching up to the top of your vagina with clean fingers to feel the threads. Do not pull on the threads. It is a good habit to check placement after each menstrual period. Call your doctor right away if you feel more of the IUD than just the threads or if you cannot feel the threads at all. The IUD may come out by itself. You may become pregnant if the device comes out. If you notice that the IUD has come out use a backup birth control method like condoms and call your health care provider. Using tampons will not change the position of the IUD and are okay to use during your period. What side effects may I notice from receiving this medicine?   Side effects that you should report to your doctor or health care professional as soon as possible: -allergic reactions like skin rash, itching or  hives, swelling of the face, lips, or tongue -fever, flu-like symptoms -genital sores -high blood pressure -no menstrual period for 6 weeks during use -pain, swelling, warmth in the leg -pelvic pain or tenderness -severe or sudden headache -signs of pregnancy -stomach cramping -sudden shortness of breath -trouble with balance, talking, or walking -unusual vaginal bleeding, discharge -yellowing of the eyes or skin Side effects that usually do not require medical attention (report to your doctor or health care professional if they continue or are bothersome): -acne -breast pain -change in sex drive or performance -changes in weight -cramping, dizziness, or faintness while the device is being inserted -headache -irregular menstrual bleeding within first 3 to 6 months of use -nausea This list may not describe all possible side effects. Call your doctor for medical advice about side effects. You may report side effects to FDA at 1-800-FDA-1088. Where should I keep my medicine? This does not apply. NOTE: This sheet is a summary. It may not cover all possible information. If you have questions about this medicine, talk to your doctor, pharmacist, or health care provider.    2016, Elsevier/Gold Standard. (2011-11-30 13:54:04)  

## 2016-03-16 NOTE — Progress Notes (Signed)
Patient ID: [name], female   DOB: [birth year], 50 y.o.   MRN: [id number]  Chief Complaint  Patient presents with  . Vaginal Bleeding    HPI [name] is a 50 y.o. female.  [id number]   HPI  [name], a 50 yo female with a history of HTN, PE, and obesity treated with gastric sleeve in 2015, presents with increased vaginal bleeding. Patient reports that for the last 8-9 months her periods have increased in length to 7-12 days (formerly 4). Similarly, her bleeding has increased to the point of changing pads every hour. She reports the passage of large clots. Patient reports significantly increased fatigue and feeling more cold than before. She describes eating ice chips for much of the day.    Past Medical History  Diagnosis Date  . Hypertension   . Deep vein thrombosis (DVT) [facility])     Past Surgical History  Procedure Laterality Date  . Carpal tunnel release    . Knee surgery      left knee  . Cesarean section      2 previous c-sections  . Tubal ligation      Family History  Problem Relation Age of Onset  . Cancer Mother     breast  . Hypertension Mother   . Hypertension Father   . Diabetes Paternal Aunt   . Diabetes Paternal Uncle   . Cancer Maternal Grandmother     breast    Social History Social History  Substance Use Topics  . Smoking status: Never Smoker   . Smokeless tobacco: Never Used  . Alcohol Use: No    Allergies  Allergen Reactions  . Darvocet [Propoxyphene N-Acetaminophen] Other (See Comments)    Some kind of reaction during surgery  . Oxycodone-Acetaminophen Itching    Does not remember  . Percocet [Oxycodone-Acetaminophen] Other (See Comments)    Does not remember  . Vicodin [Hydrocodone-Acetaminophen] Other (See Comments)    Heart to flutter  . Hydrocodone-Acetaminophen Palpitations    Heart to flutter    Current Outpatient Prescriptions  Medication Sig Dispense Refill  . acetaminophen (RA ACETAMINOPHEN) 650 MG CR tablet Take  1,300 mg by mouth.    [name] amLODipine (NORVASC) 5 MG tablet Take 5 mg by mouth daily.    . Calcium Citrate 200 MG TABS Take 200 mg by mouth.    . Cyanocobalamin (RA VITAMIN B-12 TR) 1000 MCG TBCR Take 1,000 mcg by mouth.    . hydrochlorothiazide (HYDRODIURIL) 25 MG tablet Take 25 mg by mouth daily.    [name] ibuprofen (ADVIL,MOTRIN) 800 MG tablet Take 800 mg by mouth every 8 (eight) hours as needed. For pain    . Magnesium 250 MG TABS Take 250 mg by mouth.    . Multiple Vitamin (MULTIVITAMIN) capsule Take 1 capsule by mouth.    . losartan (COZAAR) 100 MG tablet Take 100 mg by mouth. Reported on 03/16/2016    . senna-docusate (SENOKOT S) 8.6-50 MG tablet Take 1 tablet by mouth. Reported on 03/16/2016     No current facility-administered medications for this visit.    Review of Systems Review of Systems  Constitutional: Negative for fever and chills.  Respiratory: Positive for shortness of breath.   Cardiovascular: Negative for chest pain, palpitations and leg swelling.  Gastrointestinal: Negative for abdominal pain and abdominal distention.  Genitourinary: Positive for vaginal bleeding. Negative for pelvic pain.      Blood pressure 166/101, pulse 75, last menstrual period 03/12/2016.  Physical Exam Physical  Exam  Cardiovascular: Normal rate, regular rhythm, normal heart sounds and intact distal pulses.  Exam reveals no gallop and no friction rub.   Pulmonary/Chest: Effort normal and breath sounds normal. No respiratory distress. She has no wheezes. She has no rales. She exhibits no tenderness.  Abdominal: Soft. Bowel sounds are normal. She exhibits no distension and no mass. There is no tenderness. There is no rebound and no guarding.  Cervical exam: bleeding noted around os; no masses appreciated on bimanual exam    Data Reviewed Results from 2015 endometrial biopsy (no hyperplasia, atypia, or malignancy noted).  Assessment/Plan [name], a 50 year old who presents with increased vaginal  bleeding, either has AUB due to perimenopause or an underlying uterine pathology such as fibroids. She describes significant bleeding and the onset of anemic symptoms (fatigue, cold, pica).   Possible Anemia: --The extent of anemia will be evaluated with a CBC; she is currently taking iron as part of her post-gastric sleeve operation.   Perimenopausal bleeding: --Her proximity to menopause will be evaluated with a [place]. To differentiate between the causes of AUB an ultrasound will be scheduled. Given her history of PE, an IUD offers an appropriate hormonal option to control the extent of her bleeding until menopause; she is counseled on the option.   Hypertension: --Her blood pressure was hypertensive in the office today, but she began taking her new BP meds (amlodipine and HCTZ) last evening. She is advised to follow-up with primary care provider soon.     Marcello Moores [name] 03/16/2016, 1:34 PM  Attestation of Attending Supervision of medical student: Evaluation and management procedures were performed by the student under my supervision and collaboration.  I have reviewed the note and chart, and I agree with the management and plan.  [name], MD, [place] Attending [place], [facility]

## 2016-03-17 LAB — FOLLICLE STIMULATING HORMONE: FSH: 16.7 m[IU]/mL

## 2016-03-22 ENCOUNTER — Encounter: Payer: Self-pay | Admitting: General Practice

## 2016-03-22 ENCOUNTER — Ambulatory Visit (HOSPITAL_COMMUNITY)
Admission: RE | Admit: 2016-03-22 | Discharge: 2016-03-22 | Disposition: A | Discharge status: Home / Self Care | Payer: 59 | Source: Ambulatory Visit | Attending: Obstetrics & Gynecology | Admitting: Obstetrics & Gynecology

## 2016-03-22 DIAGNOSIS — B379 Candidiasis, unspecified: Secondary | ICD-10-CM

## 2016-03-22 DIAGNOSIS — N938 Other specified abnormal uterine and vaginal bleeding: Secondary | ICD-10-CM | POA: Diagnosis present

## 2016-03-22 DIAGNOSIS — D251 Intramural leiomyoma of uterus: Secondary | ICD-10-CM | POA: Insufficient documentation

## 2016-03-22 DIAGNOSIS — N83201 Unspecified ovarian cyst, right side: Secondary | ICD-10-CM | POA: Insufficient documentation

## 2016-03-22 MED ORDER — FLUCONAZOLE 150 MG PO TABS: 150.0000 mg | ORAL_TABLET | Freq: Once | ORAL | Status: DC

## 2016-03-22 NOTE — Progress Notes (Unsigned)
Patient stopped by clinic today stating she is having irritation and itching around her vagina since she came into the office. Told patient we can send her something for a yeast infection to see if that improves symptoms. Patient verbalized understanding & had no questions

## 2016-03-23 ENCOUNTER — Telehealth: Payer: Self-pay | Admitting: *Deleted

## 2016-03-23 DIAGNOSIS — B379 Candidiasis, unspecified: Secondary | ICD-10-CM

## 2016-03-23 MED ORDER — FLUCONAZOLE 150 MG PO TABS: 150.0000 mg | ORAL_TABLET | Freq: Once | ORAL | Status: DC

## 2016-03-23 NOTE — Telephone Encounter (Signed)
[name] called and states her prescription for diflucan was not at the pharmacy. I informed her it appeared it had gone thru, but I will call pharmacy and make sure they have the order.  I called pharmacy and they state they do not see the order, I resent it and asked them to let me know if it doesn't go thru.

## 2016-03-27 ENCOUNTER — Telehealth: Payer: Self-pay | Admitting: *Deleted

## 2016-03-27 DIAGNOSIS — N898 Other specified noninflammatory disorders of vagina: Secondary | ICD-10-CM

## 2016-03-27 DIAGNOSIS — B379 Candidiasis, unspecified: Secondary | ICD-10-CM

## 2016-03-27 MED ORDER — FLUCONAZOLE 150 MG PO TABS: 150.0000 mg | ORAL_TABLET | Freq: Once | ORAL | Status: DC

## 2016-03-27 NOTE — Telephone Encounter (Signed)
[name] called and left a message this am that she recently had something called in for yeast and she took it and it helped somewhat At first, but now iching and burning have came back. Wants to know if can have refill. Discussed with Dr. Elly Modena and refill approved.   Called [name] and verified same symptoms and we discussed can refill diflucan but if after this tablet her symptoms do not Improve will need to call and make appt as it may not be yeast and could be something else. She voices understanding.

## 2016-04-14 ENCOUNTER — Ambulatory Visit: Payer: 59 | Admitting: Obstetrics & Gynecology

## 2016-04-17 ENCOUNTER — Telehealth: Payer: Self-pay | Admitting: *Deleted

## 2016-04-17 NOTE — Telephone Encounter (Signed)
[name] left a message yesterday 04/16/16 on voicemail stating she has an appointment Wednesday at 3:15 with Dr. Roselie Awkward for an IUd placement. Wants to know if she can keep appointment due to her cycle starting yesterday.  Called her back and left a message yes she can keep appointment with the issue she stated on her voicemail, but if she wants to discuss- call clinic.

## 2016-04-19 ENCOUNTER — Ambulatory Visit (INDEPENDENT_AMBULATORY_CARE_PROVIDER_SITE_OTHER): Payer: 59 | Admitting: Obstetrics & Gynecology

## 2016-04-19 ENCOUNTER — Encounter: Payer: Self-pay | Admitting: Obstetrics & Gynecology

## 2016-04-19 VITALS — BP 109/74 | HR 74 | Ht 67.0 in | Wt 207.7 lb

## 2016-04-19 DIAGNOSIS — N938 Other specified abnormal uterine and vaginal bleeding: Secondary | ICD-10-CM

## 2016-04-19 MED ORDER — LEVONORGESTREL 18.6 MCG/DAY IU IUD
INTRAUTERINE_SYSTEM | Freq: Once | INTRAUTERINE | Status: AC
  Administered 2016-04-19: 17:00:00 via INTRAUTERINE

## 2016-04-19 NOTE — Progress Notes (Signed)
Subjective:     Patient ID: [name], female   DOB: January 06, 1966, 50 y.o.   MRN: [id number] CC h/o heavy periods, for IUD insertion HPI [id number] Patient's last menstrual period was 04/16/2016 (exact date). Menses is current, less flow today, comes for Liletta insertion.    Review of Systems  Cardiovascular: Negative.   Genitourinary: Positive for vaginal bleeding and menstrual problem. Negative for pelvic pain.       Objective:   Physical Exam  Constitutional: She appears well-developed. No distress.  Genitourinary: Vagina normal.  Psychiatric: She has a normal mood and affect. Her behavior is normal.  Vitals reviewed.  Patient identified, informed consent performed, signed copy in chart, time out was performed.  Urine pregnancy test negative.  Speculum placed in the vagina.  Cervix visualized.  Cleaned with Betadine x 2.  Grasped anteriorly with a single tooth tenaculum.  Uterus sounded to 8 cm.  Liletta IUD placed per manufacturer's recommendations.  Strings trimmed to 3 cm.   Patient given post procedure instructions and Liletta care card with expiration date.  Patient is asked to check IUD strings periodically and follow up in 4-6 weeks for IUD check.    Assessment:      DUB perimenopause, IUD insertion    Plan:     RTC string check in 4 week, instructions given  Woodroe Mode, MD 04/19/2016

## 2016-04-19 NOTE — Patient Instructions (Signed)
Levonorgestrel intrauterine device (IUD) What is this medicine? LEVONORGESTREL IUD (LEE voe nor jes trel) is a contraceptive (birth control) device. The device is placed inside the uterus by a healthcare professional. It is used to prevent pregnancy and can also be used to treat heavy bleeding that occurs during your period. Depending on the device, it can be used for 3 to 5 years. This medicine may be used for other purposes; ask your health care provider or pharmacist if you have questions. What should I tell my health care provider before I take this medicine? They need to know if you have any of these conditions: -abnormal Pap smear -cancer of the breast, uterus, or cervix -diabetes -endometritis -genital or pelvic infection now or in the past -have more than one sexual partner or your partner has more than one partner -heart disease -history of an ectopic or tubal pregnancy -immune system problems -IUD in place -liver disease or tumor -problems with blood clots or take blood-thinners -use intravenous drugs -uterus of unusual shape -vaginal bleeding that has not been explained -an unusual or allergic reaction to levonorgestrel, other hormones, silicone, or polyethylene, medicines, foods, dyes, or preservatives -pregnant or trying to get pregnant -breast-feeding How should I use this medicine? This device is placed inside the uterus by a health care professional. Talk to your pediatrician regarding the use of this medicine in children. Special care may be needed. Overdosage: If you think you have taken too much of this medicine contact a poison control center or emergency room at once. NOTE: This medicine is only for you. Do not share this medicine with others. What if I miss a dose? This does not apply. What may interact with this medicine? Do not take this medicine with any of the following medications: -amprenavir -bosentan -fosamprenavir This medicine may also interact with  the following medications: -aprepitant -barbiturate medicines for inducing sleep or treating seizures -bexarotene -griseofulvin -medicines to treat seizures like carbamazepine, ethotoin, felbamate, oxcarbazepine, phenytoin, topiramate -modafinil -pioglitazone -rifabutin -rifampin -rifapentine -some medicines to treat HIV infection like atazanavir, indinavir, lopinavir, nelfinavir, tipranavir, ritonavir -St. John's wort -warfarin This list may not describe all possible interactions. Give your health care provider a list of all the medicines, herbs, non-prescription drugs, or dietary supplements you use. Also tell them if you smoke, drink alcohol, or use illegal drugs. Some items may interact with your medicine. What should I watch for while using this medicine? Visit your doctor or health care professional for regular check ups. See your doctor if you or your partner has sexual contact with others, becomes HIV positive, or gets a sexual transmitted disease. This product does not protect you against HIV infection (AIDS) or other sexually transmitted diseases. You can check the placement of the IUD yourself by reaching up to the top of your vagina with clean fingers to feel the threads. Do not pull on the threads. It is a good habit to check placement after each menstrual period. Call your doctor right away if you feel more of the IUD than just the threads or if you cannot feel the threads at all. The IUD may come out by itself. You may become pregnant if the device comes out. If you notice that the IUD has come out use a backup birth control method like condoms and call your health care provider. Using tampons will not change the position of the IUD and are okay to use during your period. What side effects may I notice from receiving this medicine?   Side effects that you should report to your doctor or health care professional as soon as possible: -allergic reactions like skin rash, itching or  hives, swelling of the face, lips, or tongue -fever, flu-like symptoms -genital sores -high blood pressure -no menstrual period for 6 weeks during use -pain, swelling, warmth in the leg -pelvic pain or tenderness -severe or sudden headache -signs of pregnancy -stomach cramping -sudden shortness of breath -trouble with balance, talking, or walking -unusual vaginal bleeding, discharge -yellowing of the eyes or skin Side effects that usually do not require medical attention (report to your doctor or health care professional if they continue or are bothersome): -acne -breast pain -change in sex drive or performance -changes in weight -cramping, dizziness, or faintness while the device is being inserted -headache -irregular menstrual bleeding within first 3 to 6 months of use -nausea This list may not describe all possible side effects. Call your doctor for medical advice about side effects. You may report side effects to FDA at 1-800-FDA-1088. Where should I keep my medicine? This does not apply. NOTE: This sheet is a summary. It may not cover all possible information. If you have questions about this medicine, talk to your doctor, pharmacist, or health care provider.    2016, Elsevier/Gold Standard. (2011-11-30 13:54:04)  

## 2016-04-24 ENCOUNTER — Encounter: Payer: Self-pay | Admitting: General Practice

## 2016-05-02 ENCOUNTER — Telehealth: Payer: Self-pay

## 2016-05-02 NOTE — Telephone Encounter (Signed)
Pt has been informed of lab work

## 2016-05-02 NOTE — Telephone Encounter (Signed)
Received message on nurse line patient came to have her IUD put in. She was on her period at the time it was place. She has to continued to bleed and would like to know if this is normal.

## 2016-05-03 ENCOUNTER — Encounter: Payer: Self-pay | Admitting: Internal Medicine

## 2016-05-15 ENCOUNTER — Ambulatory Visit: Payer: 59 | Admitting: Obstetrics & Gynecology

## 2016-07-05 ENCOUNTER — Encounter (INDEPENDENT_AMBULATORY_CARE_PROVIDER_SITE_OTHER): Payer: Self-pay

## 2016-07-05 ENCOUNTER — Encounter: Payer: Self-pay | Admitting: Internal Medicine

## 2016-07-05 ENCOUNTER — Ambulatory Visit (INDEPENDENT_AMBULATORY_CARE_PROVIDER_SITE_OTHER): Payer: 59 | Admitting: Internal Medicine

## 2016-07-05 VITALS — BP 112/72 | HR 80 | Ht 67.0 in | Wt 215.2 lb

## 2016-07-05 DIAGNOSIS — Z1211 Encounter for screening for malignant neoplasm of colon: Secondary | ICD-10-CM | POA: Diagnosis not present

## 2016-07-05 MED ORDER — NA SULFATE-K SULFATE-MG SULF 17.5-3.13-1.6 GM/177ML PO SOLN: 1.0000 | Freq: Once | ORAL | 0 refills | Status: AC

## 2016-07-05 NOTE — Patient Instructions (Signed)
You have been scheduled for a colonoscopy. Please follow written instructions given to you at your visit today.  Please pick up your prep supplies at the pharmacy within the next 1-3 days. If you use inhalers (even only as needed), please bring them with you on the day of your procedure.   

## 2016-07-05 NOTE — Progress Notes (Signed)
HISTORY OF PRESENT ILLNESS:  [name] is a 50 y.o. female who is referred by her primary care provider Dr. London Pepper with chief complaint of requesting screening colonoscopy. Patient has a history of morbid obesity and is status post Roux-en-Y gastric bypass surgery at [facility] August 2015. Since that time she has lost [place] 150 pounds. She had no complications and is doing well. Her GI review of systems is negative. No family history of colon cancer. She is interested in screening colonoscopy.  REVIEW OF SYSTEMS:  All non-GI ROS negative except for sinus allergy trouble, fatigue, headaches, night sweats, arthritis  Past Medical History:  Diagnosis Date  . Arthritis   . Carpal tunnel syndrome   . Deep vein thrombosis (DVT) ([place])   . Hypertension   . Obesity   . Pulmonary embolism ([place])   . Vitamin D deficiency     Past Surgical History:  Procedure Laterality Date  . CARPAL TUNNEL RELEASE Left   . CESAREAN SECTION     2 previous c-sections  . GASTRIC BYPASS  06/2014  . KNEE SURGERY Left   . TUBAL LIGATION      Social History [name]  reports that she has never smoked. She has never used smokeless tobacco. She reports that she does not drink alcohol or use drugs.  family history includes Breast cancer in her maternal aunt and mother; COPD in her father and mother; Diabetes in her paternal aunt and paternal uncle; Heart disease in her father and mother; Hypertension in her father and mother.  Allergies  Allergen Reactions  . Darvocet [Propoxyphene N-Acetaminophen] Other (See Comments)    Some kind of reaction during surgery  . Oxycodone-Acetaminophen Itching    Does not remember  . Percocet [Oxycodone-Acetaminophen] Other (See Comments)    Does not remember  . Vicodin [Hydrocodone-Acetaminophen] Other (See Comments)    Heart to flutter  . Hydrocodone-Acetaminophen Palpitations    Heart to flutter       PHYSICAL EXAMINATION: Vital signs: BP  112/72   Pulse 80   Ht 5\' 7"  (1.702 m) Comment: without shoes  Wt 215 lb 4 oz (97.6 kg)   BMI 33.71 kg/m   Constitutional: generally well-appearing, no acute distress Psychiatric: alert and oriented x3, cooperative Eyes: extraocular movements intact, anicteric, conjunctiva pink Mouth: oral pharynx moist, no lesions Neck: supple no lymphadenopathy Cardiovascular: heart regular rate and rhythm, no murmur Lungs: clear to auscultation bilaterally Abdomen: soft, nontender, nondistended, no obvious ascites, no peritoneal signs, normal bowel sounds, no organomegaly. Prior surgical incisions well-healed Rectal:Deferred until colonoscopy Extremities: no clubbing cyanosis or edema lower extremity edema bilaterally Skin: no lesions on visible extremities Neuro: No focal deficits. Cranial nerves intact  ASSESSMENT:  #1. Colon cancer screening. Appropriate candidate without contraindication #2. Morbid obesity status post Roux-en-Y gastric bypass surgery with successful weight reduction   PLAN:  #1. Screening colonoscopy.The nature of the procedure, as well as the risks, benefits, and alternatives were carefully and thoroughly reviewed with the patient. Ample time for discussion and questions allowed. The patient understood, was satisfied, and agreed to proceed.

## 2016-07-27 ENCOUNTER — Encounter: Payer: Self-pay | Admitting: Internal Medicine

## 2016-08-07 ENCOUNTER — Encounter: Payer: 59 | Admitting: Internal Medicine

## 2016-08-07 ENCOUNTER — Telehealth: Payer: Self-pay | Admitting: Internal Medicine

## 2016-08-08 NOTE — Telephone Encounter (Signed)
Yes to late cancellation charge

## 2016-08-22 ENCOUNTER — Encounter: Payer: 59 | Admitting: Internal Medicine

## 2016-09-01 ENCOUNTER — Other Ambulatory Visit: Payer: Self-pay | Admitting: Obstetrics and Gynecology

## 2016-09-01 ENCOUNTER — Other Ambulatory Visit: Payer: Self-pay | Admitting: Family Medicine

## 2016-09-01 DIAGNOSIS — Z1231 Encounter for screening mammogram for malignant neoplasm of breast: Secondary | ICD-10-CM

## 2016-09-06 ENCOUNTER — Ambulatory Visit: Payer: 59

## 2016-10-31 ENCOUNTER — Ambulatory Visit
Admission: RE | Admit: 2016-10-31 | Discharge: 2016-10-31 | Disposition: A | Discharge status: Home / Self Care | Payer: 59 | Source: Ambulatory Visit | Attending: Family Medicine | Admitting: Family Medicine

## 2016-10-31 ENCOUNTER — Other Ambulatory Visit: Payer: Self-pay | Admitting: Family Medicine

## 2016-10-31 DIAGNOSIS — Z1231 Encounter for screening mammogram for malignant neoplasm of breast: Secondary | ICD-10-CM

## 2016-10-31 DIAGNOSIS — N644 Mastodynia: Secondary | ICD-10-CM

## 2016-11-10 ENCOUNTER — Ambulatory Visit
Admission: RE | Admit: 2016-11-10 | Discharge: 2016-11-10 | Disposition: A | Discharge status: Home / Self Care | Payer: 59 | Source: Ambulatory Visit | Attending: Family Medicine | Admitting: Family Medicine

## 2016-11-10 DIAGNOSIS — N644 Mastodynia: Secondary | ICD-10-CM

## 2016-11-13 DIAGNOSIS — H00024 Hordeolum internum left upper eyelid: Secondary | ICD-10-CM | POA: Diagnosis not present

## 2016-11-13 DIAGNOSIS — H40033 Anatomical narrow angle, bilateral: Secondary | ICD-10-CM | POA: Diagnosis not present

## 2016-11-15 DIAGNOSIS — Z719 Counseling, unspecified: Secondary | ICD-10-CM | POA: Diagnosis not present

## 2016-11-22 DIAGNOSIS — Z719 Counseling, unspecified: Secondary | ICD-10-CM | POA: Diagnosis not present

## 2016-11-29 DIAGNOSIS — Z719 Counseling, unspecified: Secondary | ICD-10-CM | POA: Diagnosis not present

## 2016-12-09 DIAGNOSIS — H00024 Hordeolum internum left upper eyelid: Secondary | ICD-10-CM | POA: Diagnosis not present

## 2017-03-07 ENCOUNTER — Emergency Department (HOSPITAL_COMMUNITY): Payer: 59

## 2017-03-07 ENCOUNTER — Encounter (HOSPITAL_COMMUNITY): Payer: Self-pay | Admitting: Emergency Medicine

## 2017-03-07 ENCOUNTER — Observation Stay (HOSPITAL_COMMUNITY)
Admission: EM | Admit: 2017-03-07 | Discharge: 2017-03-09 | Disposition: A | Discharge status: Home / Self Care | Payer: 59 | Attending: Internal Medicine | Admitting: Internal Medicine

## 2017-03-07 DIAGNOSIS — R0789 Other chest pain: Principal | ICD-10-CM | POA: Insufficient documentation

## 2017-03-07 DIAGNOSIS — I160 Hypertensive urgency: Secondary | ICD-10-CM | POA: Diagnosis not present

## 2017-03-07 DIAGNOSIS — I10 Essential (primary) hypertension: Secondary | ICD-10-CM

## 2017-03-07 DIAGNOSIS — R079 Chest pain, unspecified: Secondary | ICD-10-CM | POA: Diagnosis present

## 2017-03-07 DIAGNOSIS — D509 Iron deficiency anemia, unspecified: Secondary | ICD-10-CM

## 2017-03-07 DIAGNOSIS — M940 Chondrocostal junction syndrome [Tietze]: Secondary | ICD-10-CM

## 2017-03-07 DIAGNOSIS — Z79899 Other long term (current) drug therapy: Secondary | ICD-10-CM | POA: Insufficient documentation

## 2017-03-07 HISTORY — DX: Adverse effect of unspecified anesthetic, initial encounter: T41.45XA

## 2017-03-07 HISTORY — DX: Chest pain, unspecified: R07.9

## 2017-03-07 HISTORY — DX: Other complications of anesthesia, initial encounter: T88.59XA

## 2017-03-07 LAB — CBC WITH DIFFERENTIAL/PLATELET
Basophils Absolute: 0 10*3/uL (ref 0.0–0.1)
Basophils Relative: 0 %
Eosinophils Absolute: 0.1 10*3/uL (ref 0.0–0.7)
Eosinophils Relative: 1 %
HCT: 40.6 % (ref 36.0–46.0)
Hemoglobin: 12.2 g/dL (ref 12.0–15.0)
Lymphocytes Relative: 21 %
Lymphs Abs: 1.8 10*3/uL (ref 0.7–4.0)
MCH: 22.9 pg — ABNORMAL LOW (ref 26.0–34.0)
MCHC: 30 g/dL (ref 30.0–36.0)
MCV: 76.3 fL — ABNORMAL LOW (ref 78.0–100.0)
Monocytes Absolute: 0.6 10*3/uL (ref 0.1–1.0)
Monocytes Relative: 7 %
Neutro Abs: 5.9 10*3/uL (ref 1.7–7.7)
Neutrophils Relative %: 71 %
Platelets: 308 10*3/uL (ref 150–400)
RBC: 5.32 MIL/uL — ABNORMAL HIGH (ref 3.87–5.11)
RDW: 16.8 % — ABNORMAL HIGH (ref 11.5–15.5)
WBC: 8.5 10*3/uL (ref 4.0–10.5)

## 2017-03-07 LAB — BASIC METABOLIC PANEL
Anion gap: 9 (ref 5–15)
BUN: 13 mg/dL (ref 6–20)
CO2: 27 mmol/L (ref 22–32)
Calcium: 9.5 mg/dL (ref 8.9–10.3)
Chloride: 103 mmol/L (ref 101–111)
Creatinine, Ser: 0.76 mg/dL (ref 0.44–1.00)
GFR calc Af Amer: >60 mL/min (ref >60)
GFR calc non Af Amer: >60 mL/min (ref >60)
Glucose, Bld: 84 mg/dL (ref 65–99)
Potassium: 3.6 mmol/L (ref 3.5–5.1)
Sodium: 139 mmol/L (ref 135–145)

## 2017-03-07 LAB — TROPONIN I
Troponin I: <0.03 ng/mL (ref <0.03)
Troponin I: <0.03 ng/mL (ref <0.03)
Troponin I: <0.03 ng/mL (ref <0.03)

## 2017-03-07 LAB — RAPID URINE DRUG SCREEN, HOSP PERFORMED
Amphetamines: NOT DETECTED
Barbiturates: POSITIVE — AB
Benzodiazepines: NOT DETECTED
Cocaine: NOT DETECTED
OPIATES: POSITIVE — AB
TETRAHYDROCANNABINOL: NOT DETECTED

## 2017-03-07 LAB — D-DIMER, QUANTITATIVE: D-Dimer, Quant: <0.27 ug/mL-FEU (ref 0.00–0.50)

## 2017-03-07 MED ORDER — HEPARIN SODIUM (PORCINE) 5000 UNIT/ML IJ SOLN: 5000.0000 [IU] | Freq: Three times a day (TID) | INTRAMUSCULAR | Status: DC

## 2017-03-07 MED ORDER — SODIUM CHLORIDE 0.9% FLUSH
3.0000 mL | Freq: Two times a day (BID) | INTRAVENOUS | Status: DC
  Administered 2017-03-07 – 2017-03-09 (×4): 3 mL via INTRAVENOUS

## 2017-03-07 MED ORDER — HYDROCODONE-ACETAMINOPHEN 5-325 MG PO TABS: 1.0000 | ORAL_TABLET | Freq: Once | ORAL | Status: DC

## 2017-03-07 MED ORDER — KETOROLAC TROMETHAMINE 30 MG/ML IJ SOLN
30.0000 mg | Freq: Once | INTRAMUSCULAR | Status: AC
  Administered 2017-03-07: 30 mg via INTRAVENOUS
  Filled 2017-03-07: qty 1

## 2017-03-07 MED ORDER — ACETAMINOPHEN 650 MG RE SUPP: 650.0000 mg | Freq: Four times a day (QID) | RECTAL | Status: DC | PRN

## 2017-03-07 MED ORDER — TRAMADOL 5 MG/ML ORAL SUSPENSION: 50.0000 mg | Freq: Four times a day (QID) | ORAL | Status: DC

## 2017-03-07 MED ORDER — NITROGLYCERIN 0.4 MG SL SUBL
0.4000 mg | SUBLINGUAL_TABLET | SUBLINGUAL | Status: DC | PRN
  Administered 2017-03-07: 0.4 mg via SUBLINGUAL
  Filled 2017-03-07: qty 1

## 2017-03-07 MED ORDER — ACETAMINOPHEN 325 MG PO TABS: 650.0000 mg | ORAL_TABLET | Freq: Four times a day (QID) | ORAL | Status: DC | PRN

## 2017-03-07 MED ORDER — ONDANSETRON HCL 4 MG/2ML IJ SOLN: 4.0000 mg | Freq: Four times a day (QID) | INTRAMUSCULAR | Status: DC | PRN

## 2017-03-07 MED ORDER — HEPARIN SODIUM (PORCINE) 5000 UNIT/ML IJ SOLN
5000.0000 [IU] | Freq: Three times a day (TID) | INTRAMUSCULAR | Status: DC
  Administered 2017-03-07 – 2017-03-09 (×5): 5000 [IU] via SUBCUTANEOUS
  Filled 2017-03-07 (×3): qty 1

## 2017-03-07 MED ORDER — TRAMADOL HCL 50 MG PO TABS: 100.0000 mg | ORAL_TABLET | Freq: Four times a day (QID) | ORAL | Status: DC | PRN

## 2017-03-07 MED ORDER — KETOROLAC TROMETHAMINE 15 MG/ML IJ SOLN
15.0000 mg | Freq: Three times a day (TID) | INTRAMUSCULAR | Status: DC | PRN
  Administered 2017-03-07: 15 mg via INTRAVENOUS
  Filled 2017-03-07: qty 1

## 2017-03-07 MED ORDER — FERROUS SULFATE 325 (65 FE) MG PO TABS
325.0000 mg | ORAL_TABLET | Freq: Every day | ORAL | Status: DC
  Administered 2017-03-09: 325 mg via ORAL
  Filled 2017-03-07 (×3): qty 1

## 2017-03-07 MED ORDER — MORPHINE SULFATE (PF) 4 MG/ML IV SOLN
4.0000 mg | Freq: Once | INTRAVENOUS | Status: AC
  Administered 2017-03-07: 4 mg via INTRAVENOUS
  Filled 2017-03-07: qty 1

## 2017-03-07 MED ORDER — SODIUM CHLORIDE 0.9 % IV SOLN: 250.0000 mL | INTRAVENOUS | Status: DC | PRN

## 2017-03-07 MED ORDER — MORPHINE SULFATE (PF) 2 MG/ML IV SOLN
2.0000 mg | Freq: Once | INTRAVENOUS | Status: AC
  Administered 2017-03-07: 2 mg via INTRAVENOUS
  Filled 2017-03-07: qty 1

## 2017-03-07 MED ORDER — GI COCKTAIL ~~LOC~~
30.0000 mL | Freq: Once | ORAL | Status: AC
  Administered 2017-03-07: 30 mL via ORAL
  Filled 2017-03-07: qty 30

## 2017-03-07 MED ORDER — SODIUM CHLORIDE 0.9 % IV BOLUS (SEPSIS)
1000.0000 mL | Freq: Once | INTRAVENOUS | Status: AC
  Administered 2017-03-07: 1000 mL via INTRAVENOUS

## 2017-03-07 MED ORDER — ONDANSETRON HCL 4 MG PO TABS: 4.0000 mg | ORAL_TABLET | Freq: Four times a day (QID) | ORAL | Status: DC | PRN

## 2017-03-07 MED ORDER — HYDRALAZINE HCL 20 MG/ML IJ SOLN
10.0000 mg | Freq: Once | INTRAMUSCULAR | Status: AC
  Administered 2017-03-07: 10 mg via INTRAVENOUS
  Filled 2017-03-07: qty 1

## 2017-03-07 MED ORDER — GI COCKTAIL ~~LOC~~: 30.0000 mL | Freq: Four times a day (QID) | ORAL | Status: DC | PRN

## 2017-03-07 MED ORDER — HYDROCHLOROTHIAZIDE 25 MG PO TABS
25.0000 mg | ORAL_TABLET | Freq: Every day | ORAL | Status: DC
  Administered 2017-03-08 – 2017-03-09 (×2): 25 mg via ORAL
  Filled 2017-03-07 (×2): qty 1

## 2017-03-07 MED ORDER — ASPIRIN EC 81 MG PO TBEC
81.0000 mg | DELAYED_RELEASE_TABLET | Freq: Every day | ORAL | Status: DC
  Administered 2017-03-08 – 2017-03-09 (×2): 81 mg via ORAL
  Filled 2017-03-07 (×2): qty 1

## 2017-03-07 MED ORDER — SODIUM CHLORIDE 0.9% FLUSH: 3.0000 mL | INTRAVENOUS | Status: DC | PRN

## 2017-03-07 MED ORDER — TRAMADOL HCL 50 MG PO TABS
50.0000 mg | ORAL_TABLET | Freq: Four times a day (QID) | ORAL | Status: DC
  Administered 2017-03-07 – 2017-03-09 (×6): 50 mg via ORAL
  Filled 2017-03-07 (×6): qty 1

## 2017-03-07 NOTE — ED Notes (Signed)
Son in room with pt.

## 2017-03-07 NOTE — ED Triage Notes (Signed)
Cp and dizzy since 0745 this am has had 324 asa and 2 nitro which did help, has 20 left forearm

## 2017-03-07 NOTE — H&P (Signed)
History and Physical    [name] [id number] DOB: [birth year] DOA: 03/07/2017  PCP: London Pepper, MD Patient coming from: Work  Chief Complaint: Chest pain  HPI: [name] is a 51 y.o. female with medical history significant of DVT/PE, hypertension, obesity, carpal tunnel syndrome. Patient presenting with chest pain. Started while at work. Constant and described as a "boulder or rhino on my chest. " Worse with exertion. No change with deep respiration or with certain body movements. Associated with feeling flushed, dizzy, lightheaded with stiffness in the jaw bilaterally. Nitroglycerin and aspirin administered both at work and in the ED without improvement. Patient feels like her symptoms are getting worse. States that she had one other similar episode 6 months ago which lasted only a short while. States the symptoms are similar to when she had a previous admission for chest pain in 2012 with cardiac cath there is a patient at that time which was normal. Patient denies any recent fevers, nausea, vomiting, abdominal pain, dysuria, frequency, back pain, neck stiffness, headache, focal neurological deficit.   ED Course: Nitroglycerin , Toradol, GI cocktail and normal saline administered.   Review of Systems: As per HPI otherwise 10 point review of systems negative.   Ambulatory Status: no restrictions  Past Medical History:  Diagnosis Date  . Arthritis   . Carpal tunnel syndrome   . Deep vein thrombosis (DVT) ([place])   . Hypertension   . Obesity   . Pulmonary embolism ([place])   . Vitamin D deficiency     Past Surgical History:  Procedure Laterality Date  . CARPAL TUNNEL RELEASE Left   . CESAREAN SECTION     2 previous c-sections  . GASTRIC BYPASS  06/2014  . KNEE SURGERY Left   . LEFT HEART CATH  2012  . TUBAL LIGATION      Social History   Social History  . Marital status: Widowed    Spouse name: N/A  . Number of children: 4  . Years of education: N/A    Occupational History  . [employer]    Social History Main Topics  . Smoking status: Never Smoker  . Smokeless tobacco: Never Used  . Alcohol use No  . Drug use: No  . Sexual activity: Yes    Birth control/ protection: Surgical   Other Topics Concern  . Not on file   Social History Narrative  . No narrative on file    Allergies  Allergen Reactions  . Darvocet [Propoxyphene N-Acetaminophen] Other (See Comments)    Some kind of reaction during surgery  . Other Other (See Comments)  . Oxycodone-Acetaminophen Itching    Does not remember  . Percocet [Oxycodone-Acetaminophen] Other (See Comments)    Does not remember  . Vicodin [Hydrocodone-Acetaminophen] Other (See Comments)    Heart to flutter  . Hydrocodone-Acetaminophen Palpitations    Heart to flutter    Family History  Problem Relation Age of Onset  . Hypertension Mother   . Breast cancer Mother   . Heart disease Mother   . COPD Mother   . Hypertension Father   . Heart disease Father   . COPD Father   . Diabetes Paternal Aunt   . Diabetes Paternal Uncle   . Breast cancer Maternal Aunt     Prior to Admission medications   Medication Sig Start Date End Date Taking? Authorizing Provider  Calcium Citrate 200 MG TABS Take 200 mg by mouth daily.    Yes Historical Provider, MD  Cyanocobalamin (  RA VITAMIN B-12 TR) 1000 MCG TBCR Take 1,000 mcg by mouth daily.    Yes Historical Provider, MD  ferrous sulfate 325 (65 FE) MG tablet Take 325 mg by mouth daily with breakfast.   Yes Historical Provider, MD  hydrochlorothiazide (HYDRODIURIL) 25 MG tablet Take 25 mg by mouth daily.   Yes Historical Provider, MD  ibuprofen (ADVIL,MOTRIN) 800 MG tablet Take 800 mg by mouth every 8 (eight) hours as needed for headache. Reported on 04/19/2016   Yes Historical Provider, MD  meloxicam (MOBIC) 15 MG tablet Take 15 mg by mouth daily as needed. Arthritis pain 06/28/16  Yes Historical Provider, MD  Multiple Vitamin (MULTIVITAMIN)  capsule Take 1 capsule by mouth 3 (three) times daily.    Yes Historical Provider, MD    Physical Exam: Vitals:   03/07/17 1430 03/07/17 1530 03/07/17 1600 03/07/17 1627  BP: (!) 143/94 (!) 156/105 (!) 152/97 136/80  Pulse: 64 64 65 96  Resp: (!) 22 17 (!) 29 18  Temp:      TempSrc:      SpO2: 100% 100% 100% 100%     General:  Appears calm and comfortable Eyes:  PERRL, EOMI, normal lids, iris ENT:  grossly normal hearing, lips & tongue, mmm Neck:  no LAD, masses or thyromegaly Cardiovascular:  RRR, no m/r/g. No LE edema.  Respiratory:  CTA bilaterally, no w/r/r. Normal respiratory effort. Abdomen:  soft, ntnd, NABS Skin:  no rash or induration seen on limited exam Musculoskeletal:  grossly normal tone BUE/BLE, good ROM, no bony abnormality, CP somewhat reproducible on palpation of the parasternal region Psychiatric:  grossly normal mood and affect, speech fluent and appropriate, AOx3 Neurologic:  CN 2-12 grossly intact, moves all extremities in coordinated fashion, sensation intact  Labs on Admission: I have personally reviewed following labs and imaging studies  CBC:  Recent Labs Lab 03/07/17 1048  WBC 8.5  NEUTROABS 5.9  HGB 12.2  HCT 40.6  MCV 76.3*  PLT 707   Basic Metabolic Panel:  Recent Labs Lab 03/07/17 1048  NA 139  K 3.6  CL 103  CO2 27  GLUCOSE 84  BUN 13  CREATININE 0.76  CALCIUM 9.5   GFR: CrCl cannot be calculated (Unknown ideal weight.). Liver Function Tests: No results for input(s): AST, ALT, ALKPHOS, BILITOT, PROT, ALBUMIN in the last 168 hours. No results for input(s): LIPASE, AMYLASE in the last 168 hours. No results for input(s): AMMONIA in the last 168 hours. Coagulation Profile: No results for input(s): INR, PROTIME in the last 168 hours. Cardiac Enzymes:  Recent Labs Lab 03/07/17 1048 03/07/17 1448  TROPONINI <0.03 <0.03   BNP (last 3 results) No results for input(s): PROBNP in the last 8760 hours. HbA1C: No results for  input(s): HGBA1C in the last 72 hours. CBG: No results for input(s): GLUCAP in the last 168 hours. Lipid Profile: No results for input(s): CHOL, HDL, LDLCALC, TRIG, CHOLHDL, LDLDIRECT in the last 72 hours. Thyroid Function Tests: No results for input(s): TSH, T4TOTAL, FREET4, T3FREE, THYROIDAB in the last 72 hours. Anemia Panel: No results for input(s): VITAMINB12, FOLATE, FERRITIN, TIBC, IRON, RETICCTPCT in the last 72 hours. Urine analysis:    Component Value Date/Time   COLORURINE YELLOW 03/01/2011 1905   APPEARANCEUR CLEAR 03/01/2011 1905   LABSPEC >1.030 (H) 03/01/2011 1905   PHURINE 5.5 03/01/2011 1905   GLUCOSEU NEGATIVE 03/01/2011 [place] (A) 03/01/2011 [place] 03/01/2011 [place] 03/01/2011 1905  PROTEINUR NEGATIVE 03/01/2011 1905   UROBILINOGEN 0.2 03/01/2011 1905   NITRITE NEGATIVE 03/01/2011 1905   LEUKOCYTESUR NEGATIVE 03/01/2011 1905    Creatinine Clearance: CrCl cannot be calculated (Unknown ideal weight.).  Sepsis Labs: @LABRCNTIP (procalcitonin:4,lacticidven:4) )No results found for this or any previous visit (from the past 240 hour(s)).   Radiological Exams on Admission: Dg Chest 2 View  Result Date: 03/07/2017 CLINICAL DATA:  51 year old with acute onset of chest tightness and dizziness the began earlier today. EXAM: CHEST  2 VIEW COMPARISON:  03/11/2014, 01/28/2012 and earlier. FINDINGS: Cardiomediastinal silhouette unremarkable, unchanged. Pleuroparenchymal scarring at the right lung base, unchanged. Lungs otherwise clear. Bronchovascular markings normal. No localized airspace consolidation. No pleural effusions. No pneumothorax. Normal pulmonary vascularity. Mild degenerative changes involving the thoracic spine. IMPRESSION: No acute cardiopulmonary disease. Electronically Signed   By: Evangeline Dakin M.D.   On: 03/07/2017 12:14    EKG: Independently reviewed. Sinus. No ACS.   Assessment/Plan Active  Problems:   Chest pain   Hypertensive urgency   Microcytic anemia   Costochondritis   Chest pain: Troponin normal 2. EKG without signs of ACS. Previous cardiac workup with chronic catheterization in 2012 reported as normal. Suspect chest pain likely a combination of musculoskeletal including costochondritis and stress/anxiety. Dr. Nadyne Coombes, patient's cardiologist has been consult at and will evaluate for possibliity of more intensive workup. - cycle trop - tele - EKG in am - nitro and ASA - f/u Dr. Irven Shelling recs - Toradol  Hypertensive urgency: Systolic pressures as high as 448 with diastolic pressures as high as 114. - Hydralazine when necessary - Continue HCTZ  Microcytic anemia: Hemoglobin 12. This is above baseline of 10.5. Microcytic. - Continue iron supplementation - CBc in am   DVT prophylaxis: Hep  Code Status: full  Family Communication: boyfriend/fiance  Disposition Plan: pending r/o  Consults called: cardiology - Dr. Einar Gip  Admission status: observation    Yazmen Briones J MD [facility]  If 7PM-7AM, please contact night-coverage [email or web address] Password [facility]  03/07/2017, 5:59 PM

## 2017-03-07 NOTE — ED Notes (Signed)
Pt ambulated to restroom, no issues. 

## 2017-03-07 NOTE — ED Notes (Signed)
Paged Dr. Marily Memos about pts chest pain continuing 6/10. Waiting for return call

## 2017-03-07 NOTE — ED Provider Notes (Signed)
[facility] DEPT Provider Note   CSN: [id number] Arrival date & time: 03/07/17  1010     History   Chief Complaint Chief Complaint  Patient presents with  . Chest Pain  . Dizziness    HPI [name] is a 51 y.o. female.  HPI   51 year old female presents today with complaints of chest pressure.  Patient notes that the symptoms started approximately 730 this morning.  She notes she was at work ambulating when she developed chest pressure, dizziness, diaphoresis and nausea.  Patient notes the symptoms have continued to persist, located over her central and left lateral chest.  She describes this as "a [place] on my chest".  Patient denies any worsening symptoms with deep inspiration, ambulation.  Patient denies any abdominal pain, distal swelling, cough, hemoptysis.  She denies any pain with palpation.  Patient notes this seems similar to an episode she had back in 2012 that resulted in a cardiac cath.  Patient was given 2 nitroglycerin and aspirin prior to arrival which did not improve her symptoms.  Patient notes that initially she had some minor shortness of breath, this has significantly improved.  Patient notes last week she had a upper respiratory infection likely sinusitis that improved with over-the-counter medication.  She denies any fever cough or shortness of breath with that episode.  Cardiac History Cardiologist: none presently- [place] 2012 [facility] 2012 cath  Risk Factors:  hypertension, high cholesterol, diabetes, peripheral vascular disease, malignancy, tobacco use, cocaine or illicit drug use.   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 Arthritis   . Carpal tunnel syndrome   . Deep vein thrombosis (DVT) ([place])   . Hypertension   . Obesity   . Pulmonary  embolism ([place])   . Vitamin D deficiency     Patient Active Problem List   Diagnosis Date Noted  . Chest pain 03/07/2017  . Dysmenorrhea 09/15/2011  . Morbid obesity ([place]) 06/09/2011  . DUB (dysfunctional uterine bleeding) 06/09/2011  . History of blood clots 06/09/2011  . High blood pressure 06/09/2011  . High cholesterol 06/09/2011    Past Surgical History:  Procedure Laterality Date  . CARPAL TUNNEL RELEASE Left   . CESAREAN SECTION     2 previous c-sections  . GASTRIC BYPASS  06/2014  . KNEE SURGERY Left   . LEFT HEART CATH  2012  . TUBAL LIGATION      OB History    Gravida Para Term Preterm AB Living   5 2 1 1 3 2    SAB TAB Ectopic Multiple Live Births   3               Home Medications    Prior to Admission medications   Medication Sig Start Date End Date Taking? Authorizing Provider  Calcium Citrate 200 MG TABS Take 200 mg by mouth daily.    Yes Historical Provider, MD  Cyanocobalamin (RA VITAMIN B-12 TR) 1000 MCG TBCR Take 1,000 mcg by mouth daily.    Yes Historical Provider, MD  ferrous sulfate 325 (65 FE) MG tablet Take 325 mg by mouth daily with breakfast.   Yes Historical Provider, MD  hydrochlorothiazide (HYDRODIURIL) 25 MG tablet Take 25 mg by mouth daily.   Yes Historical Provider, MD  ibuprofen (ADVIL,MOTRIN) 800 MG tablet  Take 800 mg by mouth every 8 (eight) hours as needed for headache. Reported on 04/19/2016   Yes Historical Provider, MD  meloxicam (MOBIC) 15 MG tablet Take 15 mg by mouth daily as needed. Arthritis pain 06/28/16  Yes Historical Provider, MD  Multiple Vitamin (MULTIVITAMIN) capsule Take 1 capsule by mouth 3 (three) times daily.    Yes Historical Provider, MD    Family History Family History  Problem Relation Age of Onset  . Hypertension Mother   . Breast cancer Mother   . Heart disease Mother   . COPD Mother   . Hypertension Father   . Heart disease Father   . COPD Father   . Diabetes Paternal Aunt   . Diabetes Paternal Uncle     . Breast cancer Maternal Aunt     Social History Social History  Substance Use Topics  . Smoking status: Never Smoker  . Smokeless tobacco: Never Used  . Alcohol use No     Allergies   Darvocet [propoxyphene n-acetaminophen]; Other; Oxycodone-acetaminophen; Percocet [oxycodone-acetaminophen]; Vicodin [hydrocodone-acetaminophen]; and Hydrocodone-acetaminophen   Review of Systems Review of Systems  Constitutional: Positive for diaphoresis. Negative for fever.  Respiratory: Positive for shortness of breath. Negative for wheezing and stridor.   Cardiovascular: Positive for chest pain. Negative for palpitations and leg swelling.  Gastrointestinal: Negative for abdominal distention and abdominal pain.  Musculoskeletal: Negative for joint swelling and myalgias.  Neurological: Positive for dizziness and weakness. Negative for syncope.  Psychiatric/Behavioral: Negative for confusion.  All other systems reviewed and are negative.    Physical Exam Updated Vital Signs BP 136/80   Pulse 96   Temp 98.4 F (36.9 C) (Oral)   Resp 18   SpO2 100%   Physical Exam  Constitutional: She is oriented to person, place, and time. She appears well-developed and well-nourished.  HENT:  Head: Normocephalic and atraumatic.  Eyes: Conjunctivae are normal. Pupils are equal, round, and reactive to light. Right eye exhibits no discharge. Left eye exhibits no discharge. No scleral icterus.  Neck: Normal range of motion. No JVD present. No tracheal deviation present.  Cardiovascular: Normal rate, regular rhythm, normal heart sounds and intact distal pulses.  Exam reveals no gallop and no friction rub.   No murmur heard. Pulmonary/Chest: Effort normal and breath sounds normal. No stridor. No respiratory distress. She has no wheezes. She has no rales. She exhibits no tenderness.  Abdominal: Soft. She exhibits no distension. There is no tenderness.  Musculoskeletal: She exhibits no edema.  Neurological:  She is alert and oriented to person, place, and time. No cranial nerve deficit or sensory deficit. Coordination normal.  Skin: Skin is warm.  Psychiatric: She has a normal mood and affect. Her behavior is normal. Judgment and thought content normal.  Nursing note and vitals reviewed.    ED Treatments / Results  Labs (all labs ordered are listed, but only abnormal results are displayed) Labs Reviewed  CBC WITH DIFFERENTIAL/PLATELET - Abnormal; Notable for the following:       Result Value   RBC 5.32 (*)    MCV 76.3 (*)    MCH 22.9 (*)    RDW 16.8 (*)    All other components within normal limits  BASIC METABOLIC PANEL  TROPONIN I  D-DIMER, QUANTITATIVE (NOT AT [facility])  TROPONIN I    EKG  EKG Interpretation  Date/Time:  Wednesday March 07 2017 10:19:29 EDT Ventricular Rate:  72 PR Interval:    QRS Duration: 74 QT Interval:  410 QTC Calculation:  449 R Axis:   37 Text Interpretation:  Sinus rhythm Probable left atrial enlargement Low voltage, precordial leads Probable anteroseptal infarct, old Confirmed by Wilson Singer  MD, STEPHEN [phone]) on 03/07/2017 12:39:31 PM       Radiology Dg Chest 2 View  Result Date: 03/07/2017 CLINICAL DATA:  51 year old with acute onset of chest tightness and dizziness the began earlier today. EXAM: CHEST  2 VIEW COMPARISON:  03/11/2014, 01/28/2012 and earlier. FINDINGS: Cardiomediastinal silhouette unremarkable, unchanged. Pleuroparenchymal scarring at the right lung base, unchanged. Lungs otherwise clear. Bronchovascular markings normal. No localized airspace consolidation. No pleural effusions. No pneumothorax. Normal pulmonary vascularity. Mild degenerative changes involving the thoracic spine. IMPRESSION: No acute cardiopulmonary disease. Electronically Signed   By: Evangeline Dakin M.D.   On: 03/07/2017 12:14    Procedures Procedures (including critical care time)  Medications Ordered in ED Medications  sodium chloride 0.9 % bolus 1,000 mL (0 mLs  Intravenous Stopped 03/07/17 1255)  gi cocktail (Maalox,Lidocaine,Donnatal) (30 mLs Oral Given 03/07/17 1308)  ketorolac (TORADOL) 30 MG/ML injection 30 mg (30 mg Intravenous Given 03/07/17 1455)  morphine 4 MG/ML injection 4 mg (4 mg Intravenous Given 03/07/17 1624)  hydrALAZINE (APRESOLINE) injection 10 mg (10 mg Intravenous Given 03/07/17 1621)     Initial Impression / Assessment and Plan / ED Course  I have reviewed the triage vital signs and the nursing notes.  Pertinent labs & imaging results that were available during my care of the patient were reviewed by me and considered in my medical decision making (see chart for details).      Final Clinical Impressions(s) / ED Diagnoses   Final diagnoses:  Chest pain, unspecified type  Hypertension, unspecified type   Labs: CBC, BMP, d-dimer, troponin  Imaging: DG Chest 2 View  Consults: [facility]  [employer]: GI cocktail, Toradol (aspirin and nitro prior to arrival)   Discharge Meds:   Assessment/Plan: 51 year old female presents today with complaints of chest discomfort.  Patient is having ongoing pressure in her chest.  She is also having associated nausea, diaphoresis and dizziness.  She has a moderate heart score, initial troponin negative, EKG reassuring.  Due to patient's continued discomfort despite aspirin and nitro cardiology was consulted.  I spoke with Dr. Einar Gip who evaluated patient's findings and recommended that d-dimer be performed this patient has a history DVT and PE in the past (secondary to estrogen therapy) and admitted to the hospital service for observation overnight as he will consult on the patient.  D-dimer ordered patient will be evaluated after d-dimer for observation.  D-dimer is negative, [facility] hospitalist service will be consulted.     New Prescriptions New Prescriptions   No medications on file     Okey Regal, PA-C 03/07/17 [place], MD 03/08/17 [phone]

## 2017-03-07 NOTE — ED Notes (Signed)
Dr. Marily Memos notified of patient's continued chest pain and elevated bp. New orders received.

## 2017-03-07 NOTE — ED Notes (Signed)
Pt given [place] sandwich and gingerale. Waiting for admitting physician.

## 2017-03-07 NOTE — ED Notes (Signed)
Pt up to BR to void again, states that she is still dizzy and cp is worse when she stands up, jeff in to re eval and maDE [place] and he haas spoken to her cards dr about possible admit

## 2017-03-07 NOTE — ED Notes (Signed)
Heart healthy dinner tray ordered for patient  

## 2017-03-07 NOTE — ED Notes (Signed)
Attempted report 

## 2017-03-07 NOTE — ED Notes (Signed)
Pressure in her chest , and lightheaded , dizzy and sweaty  Has had high bp and is on HCTZ

## 2017-03-08 ENCOUNTER — Encounter (HOSPITAL_COMMUNITY): Payer: Self-pay | Admitting: General Practice

## 2017-03-08 DIAGNOSIS — M940 Chondrocostal junction syndrome [Tietze]: Secondary | ICD-10-CM | POA: Diagnosis not present

## 2017-03-08 DIAGNOSIS — I160 Hypertensive urgency: Secondary | ICD-10-CM | POA: Diagnosis not present

## 2017-03-08 DIAGNOSIS — Z9884 Bariatric surgery status: Secondary | ICD-10-CM

## 2017-03-08 DIAGNOSIS — D509 Iron deficiency anemia, unspecified: Secondary | ICD-10-CM | POA: Diagnosis not present

## 2017-03-08 DIAGNOSIS — Z79899 Other long term (current) drug therapy: Secondary | ICD-10-CM | POA: Diagnosis not present

## 2017-03-08 DIAGNOSIS — R0789 Other chest pain: Secondary | ICD-10-CM | POA: Diagnosis not present

## 2017-03-08 DIAGNOSIS — I10 Essential (primary) hypertension: Secondary | ICD-10-CM

## 2017-03-08 DIAGNOSIS — R079 Chest pain, unspecified: Secondary | ICD-10-CM | POA: Diagnosis not present

## 2017-03-08 LAB — CBC
HEMATOCRIT: 34.3 % — AB (ref 36.0–46.0)
Hemoglobin: 10.5 g/dL — ABNORMAL LOW (ref 12.0–15.0)
MCH: 23.3 pg — AB (ref 26.0–34.0)
MCHC: 30.6 g/dL (ref 30.0–36.0)
MCV: 76.1 fL — AB (ref 78.0–100.0)
Platelets: 324 10*3/uL (ref 150–400)
RBC: 4.51 MIL/uL (ref 3.87–5.11)
RDW: 17.3 % — ABNORMAL HIGH (ref 11.5–15.5)
WBC: 9 10*3/uL (ref 4.0–10.5)

## 2017-03-08 LAB — IRON AND TIBC
Iron: 19 ug/dL — ABNORMAL LOW (ref 28–170)
Saturation Ratios: 4 % — ABNORMAL LOW (ref 10.4–31.8)
TIBC: 455 ug/dL — ABNORMAL HIGH (ref 250–450)
UIBC: 436 ug/dL

## 2017-03-08 LAB — BASIC METABOLIC PANEL
ANION GAP: 8 (ref 5–15)
BUN: 18 mg/dL (ref 6–20)
CHLORIDE: 103 mmol/L (ref 101–111)
CO2: 28 mmol/L (ref 22–32)
Calcium: 9.1 mg/dL (ref 8.9–10.3)
Creatinine, Ser: 0.84 mg/dL (ref 0.44–1.00)
GFR calc Af Amer: >60 mL/min (ref >60)
GFR calc non Af Amer: >60 mL/min (ref >60)
GLUCOSE: 96 mg/dL (ref 65–99)
POTASSIUM: 4.5 mmol/L (ref 3.5–5.1)
Sodium: 139 mmol/L (ref 135–145)

## 2017-03-08 LAB — VITAMIN B12: Vitamin B-12: 968 pg/mL — ABNORMAL HIGH (ref 180–914)

## 2017-03-08 LAB — HIV ANTIBODY (ROUTINE TESTING W REFLEX): HIV Screen 4th Generation wRfx: NONREACTIVE

## 2017-03-08 LAB — FERRITIN: FERRITIN: 8 ng/mL — AB (ref 11–307)

## 2017-03-08 MED ORDER — FERUMOXYTOL INJECTION 510 MG/17 ML
510.0000 mg | Freq: Once | INTRAVENOUS | Status: AC
  Administered 2017-03-08: 510 mg via INTRAVENOUS
  Filled 2017-03-08: qty 17

## 2017-03-08 MED ORDER — PANTOPRAZOLE SODIUM 40 MG PO TBEC
40.0000 mg | DELAYED_RELEASE_TABLET | Freq: Two times a day (BID) | ORAL | Status: DC
  Administered 2017-03-08 – 2017-03-09 (×3): 40 mg via ORAL
  Filled 2017-03-08 (×3): qty 1

## 2017-03-08 MED ORDER — MORPHINE SULFATE (PF) 2 MG/ML IV SOLN
2.0000 mg | Freq: Once | INTRAVENOUS | Status: AC
Start: 2017-03-08 — End: 2017-03-08
  Administered 2017-03-08: 2 mg via INTRAVENOUS
  Filled 2017-03-08: qty 1

## 2017-03-08 MED ORDER — METOPROLOL SUCCINATE ER 25 MG PO TB24
25.0000 mg | ORAL_TABLET | Freq: Every day | ORAL | Status: DC
  Administered 2017-03-08 – 2017-03-09 (×2): 25 mg via ORAL
  Filled 2017-03-08 (×3): qty 1

## 2017-03-08 MED ORDER — ADULT MULTIVITAMIN LIQUID CH
15.0000 mL | Freq: Every day | ORAL | Status: DC
  Administered 2017-03-08 – 2017-03-09 (×2): 15 mL via ORAL
  Filled 2017-03-08 (×3): qty 15

## 2017-03-08 MED ORDER — LORAZEPAM 0.5 MG PO TABS
0.5000 mg | ORAL_TABLET | Freq: Once | ORAL | Status: AC
  Administered 2017-03-08: 0.5 mg via ORAL
  Filled 2017-03-08: qty 1

## 2017-03-08 MED ORDER — VITAMIN B-12 1000 MCG PO TABS
1000.0000 ug | ORAL_TABLET | Freq: Every day | ORAL | Status: DC
Start: 2017-03-08 — End: 2017-03-09
  Administered 2017-03-08 – 2017-03-09 (×2): 1000 ug via ORAL
  Filled 2017-03-08 (×3): qty 1

## 2017-03-08 MED ORDER — CELECOXIB 200 MG PO CAPS
200.0000 mg | ORAL_CAPSULE | Freq: Every day | ORAL | Status: DC
  Administered 2017-03-08 – 2017-03-09 (×2): 200 mg via ORAL
  Filled 2017-03-08 (×3): qty 1

## 2017-03-08 NOTE — Consult Note (Signed)
CARDIOLOGY CONSULT NOTE  Patient ID: [name] MRN: [id number] DOB/AGE: 1966/06/02 51 y.o.  Admit date: 03/07/2017 Referring Physician  Dr. Linna Darner Primary Physician:  London Pepper, MD Reason for Consultation  Chest pain  HPI: [name]  is a 51 y.o. female  With past medical history of significant for DVT/PE, hypertension that has recently not been well controlled, obesity, and carpal tunnel syndrome. As noted by Dr. Marily Memos and I agree, Patient presenting with chest pain. Started while at work. Constant and described as a "boulder or rhino on my chest. " Worse with exertion. No change with deep respiration or with certain body movements. Associated with feeling flushed, dizzy, lightheaded with stiffness in the jaw bilaterally. Nitroglycerin and aspirin administered both at work and in the ED without improvement. Patient feels like her symptoms are getting worse. States that she had one other similar episode 6 months ago which lasted only a short while. States the symptoms are similar to when she had a previous admission for chest pain in 2012 with cardiac cath there is a patient at that time which was normal. Patient denies any recent fevers, nausea, vomiting, abdominal pain, dysuria, frequency, back pain, neck stiffness, headache, focal neurological deficit. Patient was admitted overnight for observation.   This morning, the patient continues to report epigastric region chest heaviness that is constant. She states she was given Tramadol and Morphine overnight that did not resolve her symptoms. She reports that she has had GERD symptoms occasionally since her gastric bypass a few years ago and states that her symptoms today are different. She reports one episode last night were she again felt flushed and lightheaded similar to the episode she had yesterday. She does report history of panic attacks after her father passed away, but has not had any recent episodes of anxiety.   She is  fairly active and tries to achieve 10,000 steps a day. Reports that she does have exertional shortness of breath, but does not have chest pain with walking. She does have shortness of breath with climbing stairs.     Past Medical History:  Diagnosis Date  . Arthritis   . Carpal tunnel syndrome   . Deep vein thrombosis (DVT) ([place])   . Hypertension   . Obesity   . Pulmonary embolism ([place])   . Vitamin D deficiency      Past Surgical History:  Procedure Laterality Date  . CARPAL TUNNEL RELEASE Left   . CESAREAN SECTION     2 previous c-sections  . GASTRIC BYPASS  06/2014  . KNEE SURGERY Left   . LEFT HEART CATH  2012  . TUBAL LIGATION       Family History  Problem Relation Age of Onset  . Hypertension Mother   . Breast cancer Mother   . Heart disease Mother   . COPD Mother   . Hypertension Father   . Heart disease Father   . COPD Father   . Diabetes Paternal Aunt   . Diabetes Paternal Uncle   . Breast cancer Maternal Aunt      Social History: Social History   Social History  . Marital status: Widowed    Spouse name: N/A  . Number of children: 4  . Years of education: N/A   Occupational History  . [employer]    Social History Main Topics  . Smoking status: Never Smoker  . Smokeless tobacco: Never Used  . Alcohol use No  . Drug use: No  .  Sexual activity: Yes    Birth control/ protection: Surgical   Other Topics Concern  . Not on file   Social History Narrative  . No narrative on file     Prescriptions Prior to Admission  Medication Sig Dispense Refill Last Dose  . Calcium Citrate 200 MG TABS Take 200 mg by mouth daily.    03/07/2017 at Unknown time  . Cyanocobalamin (RA VITAMIN B-12 TR) 1000 MCG TBCR Take 1,000 mcg by mouth daily.    03/07/2017 at Unknown time  . ferrous sulfate 325 (65 FE) MG tablet Take 325 mg by mouth daily with breakfast.   03/07/2017 at Unknown time  . hydrochlorothiazide (HYDRODIURIL) 25 MG tablet Take 25 mg by mouth daily.    03/07/2017 at Unknown time  . ibuprofen (ADVIL,MOTRIN) 800 MG tablet Take 800 mg by mouth every 8 (eight) hours as needed for headache. Reported on 04/19/2016   Past Month at Unknown time  . meloxicam (MOBIC) 15 MG tablet Take 15 mg by mouth daily as needed. Arthritis pain   unk at unk  . Multiple Vitamin (MULTIVITAMIN) capsule Take 1 capsule by mouth 3 (three) times daily.    03/07/2017 at Unknown time     ROS: General: no fevers/chills/night sweats. Does have "anxious" feeling episodes associated with dizziness since yesterday.  Eyes: no blurry vision, diplopia, or amaurosis ENT: no sore throat or hearing loss Resp: no cough, wheezing, or hemoptysis CV: chest heaviness/pressure, shortness of breath on exertion. No shortness of breath at rest. No edema or palpitations GI: no abdominal pain, nausea, vomiting, diarrhea, or constipation GU: no dysuria, frequency, or hematuria Skin: no rash Neuro: no headache, numbness, tingling, or weakness of extremities. No syncopal episdoes. Does have associated dizziness episodes since yesterday.  Musculoskeletal: no joint pain or swelling Heme: no bleeding, DVT, or easy bruising Endo: no polydipsia or polyuria    Physical Exam: Blood pressure 132/81, pulse 67, temperature 98 F (36.7 C), temperature source Oral, resp. rate 18, height 5\' 7"  (1.702 m), weight 103.1 kg (227 lb 4.8 oz), SpO2 100 %. Body mass index is 35.6 kg/m.   Physical Exam  Constitutional: She is oriented to person, place, and time and well-developed, well-nourished, and in no distress. Vital signs are normal.  Neck: No JVD present. Carotid bruit is not present.  Cardiovascular: Normal rate, regular rhythm, S1 normal, S2 normal and intact distal pulses.  Exam reveals no gallop, no S3 and no S4.   Murmur heard.  Systolic murmur is present with a grade of 2/6  Pulses:      Radial pulses are 2+ on the right side, and 2+ on the left side.       Dorsalis pedis pulses are 2+ on the right  side, and 2+ on the left side.  Pulmonary/Chest: Effort normal and breath sounds normal. No respiratory distress. She has no decreased breath sounds. She has no wheezes. She has no rhonchi. She has no rales. She exhibits no tenderness.    Location of reported chest pressure  Abdominal: Soft. Bowel sounds are normal. She exhibits no distension. There is no tenderness. There is no rigidity and no guarding.  Neurological: She is alert and oriented to person, place, and time.  Skin: Skin is warm, dry and intact.  Psychiatric: Affect normal.  Vitals reviewed.    Labs:   Lab Results  Component Value Date   WBC 9.0 03/08/2017   HGB 10.5 (L) 03/08/2017   HCT 34.3 (L) 03/08/2017   MCV  76.1 (L) 03/08/2017   PLT 324 03/08/2017    Recent Labs Lab 03/08/17 0505  NA 139  K 4.5  CL 103  CO2 28  BUN 18  CREATININE 0.84  CALCIUM 9.1  GLUCOSE 96    Lipid Panel     Component Value Date/Time   CHOL  01/25/2009 0500    171        ATP III CLASSIFICATION:  <200     mg/dL   Desirable  200-239  mg/dL   Borderline High  >=240    mg/dL   High          TRIG 104 01/25/2009 0500   HDL 43 01/25/2009 0500   CHOLHDL 4.0 01/25/2009 0500   VLDL 21 01/25/2009 0500   LDLCALC (H) 01/25/2009 0500    107        Total Cholesterol/HDL:CHD Risk Coronary Heart Disease Risk Table                     Men   Women  1/2 Average Risk   3.4   3.3  Average Risk       5.0   4.4  2 X Average Risk   9.6   7.1  3 X Average Risk  23.4   11.0        Use the calculated Patient Ratio above and the CHD Risk Table to determine the patient's CHD Risk.        ATP III CLASSIFICATION (LDL):  <100     mg/dL   Optimal  100-129  mg/dL   Near or Above                    Optimal  130-159  mg/dL   Borderline  160-189  mg/dL   High  >190     mg/dL   Very High    BNP (last 3 results) No results for input(s): BNP in the last 8760 hours.  HEMOGLOBIN A1C Lab Results  Component Value Date   HGBA1C  01/24/2009     5.9 (NOTE)   The ADA recommends the following therapeutic goal for glycemic   control related to Hgb A1C measurement:   Goal of Therapy:   < 7.0% Hgb A1C   Reference: American Diabetes Association: Clinical Practice   Recommendations 2008, Diabetes Care,  2008, 31:(Suppl 1).   MPG 123 01/24/2009    Cardiac Panel (last 3 results)  Recent Labs  03/07/17 1048 03/07/17 1448 03/07/17 2039  TROPONINI <0.03 <0.03 <0.03    Lab Results  Component Value Date   CKTOTAL 99 01/25/2009   CKMB 0.8 01/25/2009   TROPONINI <0.03 03/07/2017     TSH No results for input(s): TSH in the last 8760 hours.  EKG: 03/08/2017: Normal sinus rhythm at 68 bpm, normal axis, normal interval, nonspecific T-wave flattening in lead 3 and aVF.  Otherwise normal EKG.    Radiology: Dg Chest 2 View  Result Date: 03/07/2017 CLINICAL DATA:  51 year old with acute onset of chest tightness and dizziness the began earlier today. EXAM: CHEST  2 VIEW COMPARISON:  03/11/2014, 01/28/2012 and earlier. FINDINGS: Cardiomediastinal silhouette unremarkable, unchanged. Pleuroparenchymal scarring at the right lung base, unchanged. Lungs otherwise clear. Bronchovascular markings normal. No localized airspace consolidation. No pleural effusions. No pneumothorax. Normal pulmonary vascularity. Mild degenerative changes involving the thoracic spine. IMPRESSION: No acute cardiopulmonary disease. Electronically Signed   By: Evangeline Dakin M.D.   On: 03/07/2017 12:14    Scheduled Meds: .  aspirin EC  81 mg Oral Daily  . ferrous sulfate  325 mg Oral Q breakfast  . heparin  5,000 Units Subcutaneous Q8H  . hydrochlorothiazide  25 mg Oral Daily  . sodium chloride flush  3 mL Intravenous Q12H  . traMADol  50 mg Oral Q6H   Continuous Infusions: . sodium chloride     PRN Meds:.sodium chloride, acetaminophen **OR** acetaminophen, gi cocktail, ketorolac, nitroGLYCERIN, ondansetron **OR** ondansetron (ZOFRAN) IV, sodium chloride  flush  ASSESSMENT AND PLAN:   1. Atypical chest pain mostly musculoskeletal etiology. 2. Hypertensive urgency 3. Dyspnea on exertion 4. Microcytic anemia  Rec: Patient continues to have constant chest pressure that she reports is localized to the epigastric region.  Symptoms are suggestive of atypical chest pain as she's had negative troponins and no changes noted to her EKG.  I have a low suspicion for any ACS. I do not feel that she needs stress testing at this time.  Previous cardiac cath was noted to be normal in 2012. She states that her cholesterol has been stable.  D-dimer was negative for any PE.  Would recommend continued workup for musculoskeletal or GI etiology with PPI and NSAIDS.  We'll have her follow-up in the outpatient setting for reevaluation.  Blood pressure is much better controlled today, she does report over the last few months having difficulty to control blood pressure.  Continue management by primary team. She has a history of microcytic anemia that is stable.   On further questioning, patient reports her last several months having dyspnea on exertion.  She is able to exercise regularly without any exercise intolerance or chest pain. Will obtain echocardiogram for further evaluation. Advised her to ambulate. Once she is without chest pain, she will be stable for discharge.  Miquel Dunn, FNP-C 03/08/2017, 8:31 AM [facility]. [place] Office: [phone] If no answer Cell [phone]

## 2017-03-08 NOTE — Progress Notes (Addendum)
PROGRESS NOTE    [name]  QZR:[id number] DOB: Feb 15, 1966 DOA: 03/07/2017 PCP: London Pepper, MD   Outpatient Specialists:     Brief Narrative:  [name] is a 51 y.o. female with medical history significant of DVT/PE, hypertension, obesity, carpal tunnel syndrome. Patient presenting with chest pain. Started while at work. Constant and described as a "boulder or rhino on my chest. " Worse with exertion. No change with deep respiration or with certain body movements. Associated with feeling flushed, dizzy, lightheaded with stiffness in the jaw bilaterally. Nitroglycerin and aspirin administered both at work and in the ED without improvement. Patient feels like her symptoms are getting worse. States that she had one other similar episode 6 months ago which lasted only a short while. States the symptoms are similar to when she had a previous admission for chest pain in 2012 with cardiac cath there is a patient at that time which was normal. Patient denies any recent fevers, nausea, vomiting, abdominal pain, dysuria, frequency, back pain, neck stiffness, headache, focal neurological deficit.   Assessment & Plan:   Active Problems:   Chest pain   Hypertensive urgency   Microcytic anemia   Costochondritis   Chest pain -suspect musculoskeletal vs mineral/vitamin def -Vit D pending -echo pending -seen by Dr. Einar Gip who will follow up as an outpatient CE negative  h/o gastric bypass  Fe def anemia -IV Fe Resume PO Fe as outpatient -suspect not absorbing  costochrondritis -NSAIDs  +UDS -both medications given in ER  DVT prophylaxis:  SCD's  Code Status: Full Code   Family communications Son on phone Floreen Comber)  Disposition Plan:  Home after echo   Consultants:   cards  Procedures:        Subjective: No CP currently, more c/o fatigue and SOB   Objective: Vitals:   03/07/17 2236 03/08/17 0211 03/08/17 0336 03/08/17 0554  BP: 110/66  132/81     Pulse: 79 69 66 67  Resp:   18   Temp:   98 F (36.7 C)   TempSrc:   Oral   SpO2: 99% 99% 98% 100%  Weight:    103.1 kg (227 lb 4.8 oz)  Height:    5\' 7"  (1.702 m)    Intake/Output Summary (Last 24 hours) at 03/08/17 1318 Last data filed at 03/07/17 2315  Gross per 24 hour  Intake              240 ml  Output              200 ml  Net               40 ml   Filed Weights   03/07/17 1955 03/08/17 0554  Weight: 103.2 kg (227 lb 9.6 oz) 103.1 kg (227 lb 4.8 oz)    Examination:  General exam: Appears calm and comfortable  Respiratory system: Clear to auscultation. Respiratory effort normal. Cardiovascular system: S1 & S2 heard, RRR. Chest wall tenderness Gastrointestinal system: Abdomen is nondistended, soft and nontender. No organomegaly or masses felt. Normal bowel sounds heard. Central nervous system: Alert and oriented. No focal neurological deficits. Extremities: Symmetric 5 x 5 power. Skin: No rashes, lesions or ulcers Psychiatry: Judgement and insight appear normal. Mood & affect appropriate.     Data Reviewed: I have personally reviewed following labs and imaging studies  CBC:  Recent Labs Lab 03/07/17 1048 03/08/17 0505  WBC 8.5 9.0  NEUTROABS 5.9  --   HGB 12.2 10.5*  HCT 40.6 34.3*  MCV 76.3* 76.1*  PLT 308 191   Basic Metabolic Panel:  Recent Labs Lab 03/07/17 1048 03/08/17 0505  NA 139 139  K 3.6 4.5  CL 103 103  CO2 27 28  GLUCOSE 84 96  BUN 13 18  CREATININE 0.76 0.84  CALCIUM 9.5 9.1   GFR: Estimated Creatinine Clearance: 96.7 mL/min (by C-G formula based on SCr of 0.84 mg/dL). Liver Function Tests: No results for input(s): AST, ALT, ALKPHOS, BILITOT, PROT, ALBUMIN in the last 168 hours. No results for input(s): LIPASE, AMYLASE in the last 168 hours. No results for input(s): AMMONIA in the last 168 hours. Coagulation Profile: No results for input(s): INR, PROTIME in the last 168 hours. Cardiac Enzymes:  Recent Labs Lab  03/07/17 1048 03/07/17 1448 03/07/17 2039  TROPONINI <0.03 <0.03 <0.03   BNP (last 3 results) No results for input(s): PROBNP in the last 8760 hours. HbA1C: No results for input(s): HGBA1C in the last 72 hours. CBG: No results for input(s): GLUCAP in the last 168 hours. Lipid Profile: No results for input(s): CHOL, HDL, LDLCALC, TRIG, CHOLHDL, LDLDIRECT in the last 72 hours. Thyroid Function Tests: No results for input(s): TSH, T4TOTAL, FREET4, T3FREE, THYROIDAB in the last 72 hours. Anemia Panel:  Recent Labs  03/08/17 1031  VITAMINB12 968*  FERRITIN 8*  TIBC 455*  IRON 19*   Urine analysis:    Component Value Date/Time   COLORURINE YELLOW 03/01/2011 1905   APPEARANCEUR CLEAR 03/01/2011 1905   LABSPEC >1.030 (H) 03/01/2011 1905   PHURINE 5.5 03/01/2011 1905   GLUCOSEU NEGATIVE 03/01/2011 1905   HGBUR MODERATE (A) 03/01/2011 1905   BILIRUBINUR NEGATIVE 03/01/2011 1905   KETONESUR NEGATIVE 03/01/2011 1905   PROTEINUR NEGATIVE 03/01/2011 1905   UROBILINOGEN 0.2 03/01/2011 1905   NITRITE NEGATIVE 03/01/2011 1905   LEUKOCYTESUR NEGATIVE 03/01/2011 1905   Sepsis Labs: @LABRCNTIP (procalcitonin:4,lacticidven:4)  )No results found for this or any previous visit (from the past 240 hour(s)).    Anti-infectives    None       Radiology Studies: Dg Chest 2 View  Result Date: 03/07/2017 CLINICAL DATA:  51 year old with acute onset of chest tightness and dizziness the began earlier today. EXAM: CHEST  2 VIEW COMPARISON:  03/11/2014, 01/28/2012 and earlier. FINDINGS: Cardiomediastinal silhouette unremarkable, unchanged. Pleuroparenchymal scarring at the right lung base, unchanged. Lungs otherwise clear. Bronchovascular markings normal. No localized airspace consolidation. No pleural effusions. No pneumothorax. Normal pulmonary vascularity. Mild degenerative changes involving the thoracic spine. IMPRESSION: No acute cardiopulmonary disease. Electronically Signed   By: Evangeline Dakin M.D.   On: 03/07/2017 12:14        Scheduled Meds: . aspirin EC  81 mg Oral Daily  . celecoxib  200 mg Oral Daily  . ferrous sulfate  325 mg Oral Q breakfast  . heparin  5,000 Units Subcutaneous Q8H  . hydrochlorothiazide  25 mg Oral Daily  . metoprolol succinate  25 mg Oral Daily  . multivitamin  15 mL Oral Daily  . pantoprazole  40 mg Oral BID  . sodium chloride flush  3 mL Intravenous Q12H  . traMADol  50 mg Oral Q6H  . vitamin B-12  1,000 mcg Oral Daily   Continuous Infusions: . sodium chloride    . ferumoxytol       LOS: 0 days    Time spent: 25 min    [place], DO [facility] Pager [phone]  If 7PM-7AM, please contact night-coverage [email or web address] Password [email or web address] 03/08/2017,  1:18 PM

## 2017-03-09 ENCOUNTER — Observation Stay (HOSPITAL_COMMUNITY): Payer: 59

## 2017-03-09 DIAGNOSIS — R0789 Other chest pain: Secondary | ICD-10-CM | POA: Diagnosis not present

## 2017-03-09 DIAGNOSIS — M940 Chondrocostal junction syndrome [Tietze]: Secondary | ICD-10-CM | POA: Diagnosis not present

## 2017-03-09 DIAGNOSIS — R079 Chest pain, unspecified: Secondary | ICD-10-CM | POA: Diagnosis not present

## 2017-03-09 DIAGNOSIS — I160 Hypertensive urgency: Secondary | ICD-10-CM | POA: Diagnosis not present

## 2017-03-09 LAB — ECHOCARDIOGRAM COMPLETE
Height: 67 in
Weight: 3588.8 oz

## 2017-03-09 LAB — VITAMIN D 25 HYDROXY (VIT D DEFICIENCY, FRACTURES): VIT D 25 HYDROXY: 15 ng/mL — AB (ref 30.0–100.0)

## 2017-03-09 MED ORDER — ASPIRIN 81 MG PO TBEC: 81.0000 mg | DELAYED_RELEASE_TABLET | Freq: Every day | ORAL | 0 refills | Status: AC

## 2017-03-09 MED ORDER — VITAMIN D (ERGOCALCIFEROL) 1.25 MG (50000 UNIT) PO CAPS: 50000.0000 [IU] | ORAL_CAPSULE | ORAL | 0 refills | Status: DC

## 2017-03-09 MED ORDER — PANTOPRAZOLE SODIUM 40 MG PO TBEC: 40.0000 mg | DELAYED_RELEASE_TABLET | Freq: Two times a day (BID) | ORAL | 0 refills | Status: AC

## 2017-03-09 MED ORDER — NITROGLYCERIN 0.4 MG SL SUBL: 0.4000 mg | SUBLINGUAL_TABLET | SUBLINGUAL | 0 refills | Status: AC | PRN

## 2017-03-09 MED ORDER — METOPROLOL SUCCINATE ER 25 MG PO TB24: 25.0000 mg | ORAL_TABLET | Freq: Every day | ORAL | 0 refills | Status: DC

## 2017-03-09 MED ORDER — LORAZEPAM 0.5 MG PO TABS: 0.5000 mg | ORAL_TABLET | Freq: Three times a day (TID) | ORAL | 0 refills | Status: DC

## 2017-03-09 NOTE — Progress Notes (Addendum)
Subjective:  No specific complaints, states that she has lack of appetite. Still complains of dizziness. Ambulated in the hallway without any chest pain or dyspnea. No leg edema. Objective:  Vital Signs in the last 24 hours: Temp:  [98.1 F (36.7 C)-98.6 F (37 C)] 98.1 F (36.7 C) (04/27 0432) Pulse Rate:  [59-68] 59 (04/27 0432) Resp:  [18-20] 18 (04/26 2012) BP: (121-151)/(80-91) 121/80 (04/27 0432) SpO2:  [98 %-100 %] 100 % (04/27 0432) Weight:  [101.7 kg (224 lb 4.8 oz)] 101.7 kg (224 lb 4.8 oz) (04/27 0432)  Intake/Output from previous day: 04/26 0701 - 04/27 0700 In: 240 [P.O.:240] Out: -   Physical Exam: Blood pressure 121/80, pulse (!) 59, temperature 98.1 F (36.7 C), temperature source Oral, resp. rate 18, height 5\' 7"  (1.702 m), weight 101.7 kg (224 lb 4.8 oz), SpO2 100 %. Body mass index is 35.13 kg/m.   General appearance: alert, no distress and moderately obese Eyes: negative findings: lids and lashes normal Neck: no adenopathy, no carotid bruit, no JVD, supple, symmetrical, trachea midline and thyroid not enlarged, symmetric, no tenderness/mass/nodules Neck: JVP - normal, carotids 2+= without bruits Resp: clear to auscultation bilaterally Chest wall: no tenderness Cardio: regular rate and rhythm, S1, S2 normal, no murmur, click, rub or gallop GI: soft, non-tender; bowel sounds normal; no masses,  no organomegaly Extremities: extremities normal, atraumatic, no cyanosis or edema    Lab Results: BMP  Recent Labs  03/07/17 1048 03/08/17 0505  NA 139 139  K 3.6 4.5  CL 103 103  CO2 27 28  GLUCOSE 84 96  BUN 13 18  CREATININE 0.76 0.84  CALCIUM 9.5 9.1  GFRNONAA >60 >60  GFRAA >60 >60    CBC  Recent Labs Lab 03/07/17 1048 03/08/17 0505  WBC 8.5 9.0  RBC 5.32* 4.51  HGB 12.2 10.5*  HCT 40.6 34.3*  PLT 308 324  MCV 76.3* 76.1*  MCH 22.9* 23.3*  MCHC 30.0 30.6  RDW 16.8* 17.3*  LYMPHSABS 1.8  --   MONOABS 0.6  --   EOSABS 0.1  --    BASOSABS 0.0  --     HEMOGLOBIN A1C Lab Results  Component Value Date   HGBA1C  01/24/2009    5.9 (NOTE)   The ADA recommends the following therapeutic goal for glycemic   control related to Hgb A1C measurement:   Goal of Therapy:   < 7.0% Hgb A1C   Reference: American Diabetes Association: Clinical Practice   Recommendations 2008, Diabetes Care,  2008, 31:(Suppl 1).   MPG 123 01/24/2009    Cardiac Panel (last 3 results)  Recent Labs  03/07/17 1048 03/07/17 1448 03/07/17 2039  TROPONINI <0.03 <0.03 <0.03   Hepatic Function Panel No results for input(s): PROT, ALBUMIN, AST, ALT, ALKPHOS, BILITOT, BILIDIR, IBILI in the last 8760 hours.  Imaging: Imaging results have been reviewed  Cardiac Studies:  EKG: 03/08/2017: Normal sinus rhythm at 68 bpm, normal axis, normal interval, nonspecific T-wave flattening in lead 3 and aVF.  Otherwise normal EKG.  ECHO: Preliminary report: Normal LV systolic function, no significant valvular abnormality. No pericardial effusion.   Assessment/Plan:  1. Chest pain which was reproducible, appears to be musculoskeletal and relieved with Toradol, no response with nitroglycerin. Normal EKG and normal cardiac markers. 2. Dizziness, probably related to microcytic anemia from iron deficiency and probably mild absorption due to history of gastric bypass also probably mild dehydration may be contributing. 3. Hypertension 4. Moderate obesity  Recommendation: I have reviewed  the echocardiogram at the bedside, formal report to follow. No chest pain on ambulation, she can be discharged on Procardia extended point. I will consider doing a routine treadmill stress test in the outpatient basis. Negative cardiac catheterization in 2012  Adrian Prows, M.D. 03/09/2017, 8:16 AM [facility] Pager: [phone] Office: [phone] If no answer: [phone] Patient was extremely anxious and requested prescription for Ativan which I prescribed 0.5  mg q.8 hours p.r.n. with 30 tablets and no refills.

## 2017-03-09 NOTE — Progress Notes (Signed)
  Echocardiogram 2D Echocardiogram has been performed.  [name] M 03/09/2017, 8:36 AM

## 2017-03-09 NOTE — Discharge Summary (Addendum)
[name], is a 51 y.o. female  DOB [birth year]  MRN [id number].  Admission date:  03/07/2017  Admitting Physician  Waldemar Dickens, MD  Discharge Date:  03/09/2017   Primary MD  London Pepper, MD  Recommendations for primary care physician for things to follow:    Cp thought to be atypical secondary to hypertensive urgency and musculoskeletal pain ? costochondritis D dimer negative , trop I negative x3 Review of lipid panel in 01/25/2009   LDL=107 Continue aspirin til sees cardiology Please f/u with Adrian Prows   Hypertension Added metoprolol succinate 25mg  po qday for improved bp control Continue hydrochlorothiazide  Vitamin D def (vitamin D level =15) Start vitamin D 50,000IU po qweek f/u with pcp for repeat vitamin D level  Iron deficiency probably due to gastric bypass Please continue ferrous sulfate, defer to PCP to refer for colonoscopy, consider checking for celiac panel Please repeat iron studies in 6 weeks and if low then please refer patient to [facility] IV iron infusion with INJECTAFER  Anxiety Xanax as per [facility]   Admission Diagnosis  Chest pain, unspecified type [R07.9] Hypertension, unspecified type [I10]   Discharge Diagnosis  Chest pain, unspecified type [R07.9] Hypertension, unspecified type [I10]    Active Problems:   Chest pain   Hypertensive urgency   Microcytic anemia   Costochondritis      Past Medical History:  Diagnosis Date  . Arthritis   . Carpal tunnel syndrome   . Chest pain 03/07/2017  . Complication of anesthesia    " it makes me hallucinate "  . Deep vein thrombosis (DVT) ([place])   . Hypertension   . Obesity   . Pulmonary embolism ([place])   . Vitamin D deficiency     Past Surgical History:  Procedure Laterality Date  . CARPAL TUNNEL RELEASE Left   . CESAREAN SECTION     2 previous c-sections  . GASTRIC BYPASS  06/2014   . KNEE SURGERY Left   . LEFT HEART CATH  2012  . TUBAL LIGATION         HPI  from the history and physical done on the day of admission:     51 y.o. female with medical history significant of DVT/PE, hypertension, obesity, carpal tunnel syndrome. Patient presenting with chest pain. Started while at work. Constant and described as a "boulder or rhino on my chest. " Worse with exertion. No change with deep respiration or with certain body movements. Associated with feeling flushed, dizzy, lightheaded with stiffness in the jaw bilaterally. Nitroglycerin and aspirin administered both at work and in the ED without improvement. Patient feels like her symptoms are getting worse. States that she had one other similar episode 6 months ago which lasted only a short while. States the symptoms are similar to when she had a previous admission for chest pain in 2012 with cardiac cath there is a patient at that time which was normal. Patient denies any recent fevers, nausea, vomiting, abdominal pain, dysuria,  frequency, back pain, neck stiffness, headache, focal neurological deficit.   ED Course: Nitroglycerin , Toradol, GI cocktail and normal saline administered.     Hospital Course:     Pt was admitted and trop I negative x3.   D dimer negative.  CXR  Negative for acute process.  Pt UDS 4/25 (23:25)   showed barbituate and opioid positive (unclear if got any in ED).  Hiv negative antibody test.  Iron saturation low at 4%, and her vitamin D level low at 15.  Cardiology was consulted and thought that her chest pain more likely related to GI source.  Recommended continuation of PPI,  And cardiac echo 4/25=>   Her bp was high on admission and metoprolol was added to her bp regimen.  Pt appears stable and will be discharge home today.     Follow UP  Follow-up Information    MORROW, AARON, MD Follow up in 1 week(s).   Specialty:  Family Medicine Contact information: [place] [address] [phone]        Adrian Prows, MD Follow up in 1 week(s).   Specialty:  Cardiology Contact information: [address] [place] [address] [phone]            Consults obtained - cardiology  Discharge Condition: stable  Diet and Activity recommendation: See Discharge Instructions below  Discharge Instructions         Discharge Medications     Allergies as of 03/09/2017      Reactions   Darvocet [propoxyphene N-acetaminophen] Other (See Comments)   Some kind of reaction during surgery   Other Other (See Comments)   Oxycodone-acetaminophen Itching   Does not remember   Percocet [oxycodone-acetaminophen] Other (See Comments)   Does not remember   Vicodin [hydrocodone-acetaminophen] Other (See Comments)   Heart to flutter   Hydrocodone-acetaminophen Palpitations   Heart to flutter      Medication List    STOP taking these medications   ibuprofen 800 MG tablet Commonly known as:  ADVIL,MOTRIN     TAKE these medications   aspirin 81 MG EC tablet Take 1 tablet (81 mg total) by mouth daily.   Calcium Citrate 200 MG Tabs Take 200 mg by mouth daily.   ferrous sulfate 325 (65 FE) MG tablet Take 325 mg by mouth daily with breakfast.   hydrochlorothiazide 25 MG tablet Commonly known as:  HYDRODIURIL Take 25 mg by mouth daily.   LORazepam 0.5 MG tablet Commonly known as:  ATIVAN Take 1 tablet (0.5 mg total) by mouth every 8 (eight) hours.   meloxicam 15 MG tablet Commonly known as:  MOBIC Take 15 mg by mouth daily as needed. Arthritis pain   metoprolol succinate 25 MG 24 hr tablet Commonly known as:  TOPROL-XL Take 1 tablet (25 mg total) by mouth daily.   multivitamin capsule Take 1 capsule by mouth 3 (three) times daily.   nitroGLYCERIN 0.4 MG SL tablet Commonly known as:  NITROSTAT Place 1 tablet (0.4 mg total) under the tongue every 5 (five) minutes as needed for chest pain.   pantoprazole 40 MG  tablet Commonly known as:  PROTONIX Take 1 tablet (40 mg total) by mouth 2 (two) times daily.   RA VITAMIN B-12 TR 1000 MCG Tbcr Generic drug:  Cyanocobalamin Take 1,000 mcg by mouth daily.   Vitamin D (Ergocalciferol) 50000 units Caps capsule Commonly known as:  DRISDOL Take 1 capsule (50,000 Units total) by  mouth every 7 (seven) days.       Major procedures and Radiology Reports - PLEASE review detailed and final reports for all details, in brief -      Dg Chest 2 View  Result Date: 03/07/2017 CLINICAL DATA:  51 year old with acute onset of chest tightness and dizziness the began earlier today. EXAM: CHEST  2 VIEW COMPARISON:  03/11/2014, 01/28/2012 and earlier. FINDINGS: Cardiomediastinal silhouette unremarkable, unchanged. Pleuroparenchymal scarring at the right lung base, unchanged. Lungs otherwise clear. Bronchovascular markings normal. No localized airspace consolidation. No pleural effusions. No pneumothorax. Normal pulmonary vascularity. Mild degenerative changes involving the thoracic spine. IMPRESSION: No acute cardiopulmonary disease. Electronically Signed   By: Evangeline Dakin M.D.   On: 03/07/2017 12:14    Micro Results     No results found for this or any previous visit (from the past 240 hour(s)).     Today   Subjective    Deirdre Gryder today has no further chest pain.   no headache,no chest abdominal pain,no new weakness tingling or numbness, feels much better wants to go home today.      Objective   Blood pressure 131/88, pulse 77, temperature 97.9 F (36.6 C), temperature source Oral, resp. rate 17, height 5\' 7"  (1.702 m), weight 101.7 kg (224 lb 4.8 oz), SpO2 100 %.   Intake/Output Summary (Last 24 hours) at 03/09/17 1657 Last data filed at 03/09/17 1500  Gross per 24 hour  Intake              480 ml  Output                0 ml  Net              480 ml    Exam Awake Alert, Oriented x 3, No new F.N deficits, Normal  affect [place].AT,PERRAL Supple Neck,No JVD, No cervical lymphadenopathy appriciated.  Symmetrical Chest wall movement, Good air movement bilaterally, CTAB RRR s1, s2, 2/6 sem apex +ve B.Sounds, Abd Soft, Non tender, No organomegaly appriciated, No rebound -guarding or rigidity. No Cyanosis, Clubbing or edema, No new Rash or bruise   Data Review   CBC w Diff:  Lab Results  Component Value Date   WBC 9.0 03/08/2017   HGB 10.5 (L) 03/08/2017   HCT 34.3 (L) 03/08/2017   PLT 324 03/08/2017   LYMPHOPCT 21 03/07/2017   MONOPCT 7 03/07/2017   EOSPCT 1 03/07/2017   BASOPCT 0 03/07/2017    CMP:  Lab Results  Component Value Date   NA 139 03/08/2017   K 4.5 03/08/2017   CL 103 03/08/2017   CO2 28 03/08/2017   BUN 18 03/08/2017   CREATININE 0.84 03/08/2017   PROT 7.7 07/16/2011   ALBUMIN 3.1 (L) 07/16/2011   BILITOT 0.2 (L) 07/16/2011   ALKPHOS 66 07/16/2011   AST 20 07/16/2011   ALT 13 07/16/2011  .   Total Time in preparing paper work, data evaluation and todays exam - 38 minutes  Jani Gravel M.D on 03/09/2017 at 4:57 PM  [facility]   Office  [phone]

## 2017-03-09 NOTE — Progress Notes (Signed)
Patient ambulated around the unit once with RN alongside. Did c/o mild dizziness. Stopped twice but other than that, did well. Oxygen sats maintained 99% on room air. BP 121/80. Stated she gets these "dizzy spells" at work and home when she ambulates but do not last long. Denies being hot or chest pain. Will continue to monitor.

## 2017-03-13 DIAGNOSIS — D509 Iron deficiency anemia, unspecified: Secondary | ICD-10-CM | POA: Diagnosis not present

## 2017-03-13 DIAGNOSIS — Z1211 Encounter for screening for malignant neoplasm of colon: Secondary | ICD-10-CM | POA: Diagnosis not present

## 2017-03-13 DIAGNOSIS — R42 Dizziness and giddiness: Secondary | ICD-10-CM | POA: Diagnosis not present

## 2017-03-16 ENCOUNTER — Encounter: Payer: Self-pay | Admitting: Internal Medicine

## 2017-03-20 DIAGNOSIS — Z713 Dietary counseling and surveillance: Secondary | ICD-10-CM | POA: Diagnosis not present

## 2017-03-26 DIAGNOSIS — R0789 Other chest pain: Secondary | ICD-10-CM | POA: Diagnosis not present

## 2017-03-26 DIAGNOSIS — R0602 Shortness of breath: Secondary | ICD-10-CM | POA: Diagnosis not present

## 2017-03-26 DIAGNOSIS — I10 Essential (primary) hypertension: Secondary | ICD-10-CM | POA: Diagnosis not present

## 2017-04-24 DIAGNOSIS — R143 Flatulence: Secondary | ICD-10-CM | POA: Diagnosis not present

## 2017-05-15 ENCOUNTER — Ambulatory Visit (AMBULATORY_SURGERY_CENTER): Payer: Self-pay | Admitting: *Deleted

## 2017-05-15 VITALS — Ht 67.0 in | Wt 224.0 lb

## 2017-05-15 DIAGNOSIS — Z1211 Encounter for screening for malignant neoplasm of colon: Secondary | ICD-10-CM

## 2017-05-15 MED ORDER — NA SULFATE-K SULFATE-MG SULF 17.5-3.13-1.6 GM/177ML PO SOLN: 1.0000 | Freq: Once | ORAL | 0 refills | Status: AC

## 2017-05-15 NOTE — Progress Notes (Signed)
Denies allergies to eggs or soy products. Denies complications with sedation or anesthesia. Denies O2 use. Denies use of diet or weight loss medications.  Emmi instructions given for colonoscopy.  

## 2017-05-21 ENCOUNTER — Encounter: Payer: Self-pay | Admitting: Internal Medicine

## 2017-05-29 ENCOUNTER — Ambulatory Visit (AMBULATORY_SURGERY_CENTER): Payer: 59 | Admitting: Internal Medicine

## 2017-05-29 ENCOUNTER — Encounter: Payer: Self-pay | Admitting: Internal Medicine

## 2017-05-29 VITALS — BP 125/81 | HR 64 | Temp 97.8°F | Resp 11 | Ht 67.0 in | Wt 224.0 lb

## 2017-05-29 DIAGNOSIS — Z1212 Encounter for screening for malignant neoplasm of rectum: Secondary | ICD-10-CM

## 2017-05-29 DIAGNOSIS — D123 Benign neoplasm of transverse colon: Secondary | ICD-10-CM

## 2017-05-29 DIAGNOSIS — Z1211 Encounter for screening for malignant neoplasm of colon: Secondary | ICD-10-CM | POA: Diagnosis present

## 2017-05-29 DIAGNOSIS — D120 Benign neoplasm of cecum: Secondary | ICD-10-CM | POA: Diagnosis not present

## 2017-05-29 MED ORDER — SODIUM CHLORIDE 0.9 % IV SOLN: 500.0000 mL | INTRAVENOUS | Status: AC

## 2017-05-29 NOTE — Progress Notes (Signed)
Data entered in error by [facility] CRNA

## 2017-05-29 NOTE — Progress Notes (Signed)
Called to room to assist during endoscopic procedure.  Patient ID and intended procedure confirmed with present staff. Received instructions for my participation in the procedure from the performing physician.  

## 2017-05-29 NOTE — Progress Notes (Signed)
Spontaneous respirations throughout. VSS. Resting comfortably. To PACU on room air. Report to  Jane RN. 

## 2017-05-29 NOTE — Op Note (Signed)
[facility] Patient Name: [name] Procedure Date: 05/29/2017 8:15 AM MRN: [id number] Endoscopist: Docia Chuck. Henrene Pastor , MD Age: 51 Referring MD:  Date of Birth: [birth year] Gender: Female Account #: [id number] Procedure:                Colonoscopy with cold snare x 4 Indications:              Screening for colorectal malignant neoplasm Medicines:                Monitored Anesthesia Care Procedure:                Pre-Anesthesia Assessment:                           - Prior to the procedure, a History and Physical                            was performed, and patient medications and                            allergies were reviewed. The patient's tolerance of                            previous anesthesia was also reviewed. The risks                            and benefits of the procedure and the sedation                            options and risks were discussed with the patient.                            All questions were answered, and informed consent                            was obtained. Prior Anticoagulants: The patient has                            taken no previous anticoagulant or antiplatelet                            agents. ASA Grade Assessment: II - A patient with                            mild systemic disease. After reviewing the risks                            and benefits, the patient was deemed in                            satisfactory condition to undergo the procedure.                           After obtaining informed consent, the colonoscope  was passed under direct vision. Throughout the                            procedure, the patient's blood pressure, pulse, and                            oxygen saturations were monitored continuously. The                            Model CF-HQ190L [id number]) scope was introduced                            through the anus and advanced to the the cecum,   identified by appendiceal orifice and ileocecal                            valve. The ileocecal valve, appendiceal orifice,                            and rectum were photographed. The quality of the                            bowel preparation was excellent. The colonoscopy                            was performed without difficulty. The patient                            tolerated the procedure well. The bowel preparation                            used was SUPREP. Scope In: 8:17:10 AM Scope Out: 8:37:35 AM Scope Withdrawal Time: 0 hours 16 minutes 45 seconds  Total Procedure Duration: 0 hours 20 minutes 25 seconds  Findings:                 Four polyps were found in the proximal transverse                            colon and cecum. The polyps were 2 mm in size.                            These polyps were removed with a cold snare.                            Resection and retrieval were complete.                           Internal hemorrhoids were found during                            retroflexion. The hemorrhoids were small.                           The exam was otherwise without  abnormality on                            direct and retroflexion views. Complications:            No immediate complications. Estimated blood loss:                            None. Estimated Blood Loss:     Estimated blood loss: none. Impression:               - Four 2 mm polyps in the proximal transverse colon                            and in the cecum, removed with a cold snare.                            Resected and retrieved.                           - Internal hemorrhoids.                           - The examination was otherwise normal on direct                            and retroflexion views. Recommendation:           - Repeat colonoscopy in 3 - 5 years for                            surveillance.                           - Patient has a contact number available for                             emergencies. The signs and symptoms of potential                            delayed complications were discussed with the                            patient. Return to normal activities tomorrow.                            Written discharge instructions were provided to the                            patient.                           - Resume previous diet.                           - Continue present medications.                           -  Await pathology results. Docia Chuck. Henrene Pastor, MD 05/29/2017 8:44:49 AM This report has been signed electronically.

## 2017-05-29 NOTE — Progress Notes (Signed)
Alert and oriented x3, pleased with MAC, report to RN  

## 2017-05-29 NOTE — Progress Notes (Signed)
Pt's states no medical or surgical changes since previsit or office visit. 

## 2017-05-29 NOTE — Progress Notes (Signed)
Data was entered in error by [place]

## 2017-05-29 NOTE — Patient Instructions (Signed)
YOU HAD AN ENDOSCOPIC PROCEDURE TODAY AT THE [facility]:   Refer to the procedure report that was given to you for any specific questions about what was found during the examination.  If the procedure report does not answer your questions, please call your gastroenterologist to clarify.  If you requested that your care partner not be given the details of your procedure findings, then the procedure report has been included in a sealed envelope for you to review at your convenience later.  YOU SHOULD EXPECT: Some feelings of bloating in the abdomen. Passage of more gas than usual.  Walking can help get rid of the air that was put into your GI tract during the procedure and reduce the bloating. If you had a lower endoscopy (such as a colonoscopy or flexible sigmoidoscopy) you may notice spotting of blood in your stool or on the toilet paper. If you underwent a bowel prep for your procedure, you may not have a normal bowel movement for a few days.  Please Note:  You might notice some irritation and congestion in your nose or some drainage.  This is from the oxygen used during your procedure.  There is no need for concern and it should clear up in a day or so.  SYMPTOMS TO REPORT IMMEDIATELY:   Following lower endoscopy (colonoscopy or flexible sigmoidoscopy):  Excessive amounts of blood in the stool  Significant tenderness or worsening of abdominal pains  Swelling of the abdomen that is new, acute  Fever of 100F or higher   Following upper endoscopy (EGD)  Vomiting of blood or coffee ground material  New chest pain or pain under the shoulder blades  Painful or persistently difficult swallowing  New shortness of breath  Fever of 100F or higher  Black, tarry-looking stools  For urgent or emergent issues, a gastroenterologist can be reached at any hour by calling [phone].   DIET:  We do recommend a small meal at first, but then you may proceed to your regular diet.  Drink  plenty of fluids but you should avoid alcoholic beverages for 24 hours.  ACTIVITY:  You should plan to take it easy for the rest of today and you should NOT DRIVE or use heavy machinery until tomorrow (because of the sedation medicines used during the test).    FOLLOW UP: Our staff will call the number listed on your records the next business day following your procedure to check on you and address any questions or concerns that you may have regarding the information given to you following your procedure. If we do not reach you, we will leave a message.  However, if you are feeling well and you are not experiencing any problems, there is no need to return our call.  We will assume that you have returned to your regular daily activities without incident.  If any biopsies were taken you will be contacted by phone or by letter within the next 1-3 weeks.  Please call us at [phone] if you have not heard about the biopsies in 3 weeks.    SIGNATURES/CONFIDENTIALITY: You and/or your care partner have signed paperwork which will be entered into your electronic medical record.  These signatures attest to the fact that that the information above on your After Visit Summary has been reviewed and is understood.  Full responsibility of the confidentiality of this discharge information lies with you and/or your care-partner.  Polyp and hemorrhoid information given. 

## 2017-05-30 ENCOUNTER — Telehealth: Payer: Self-pay | Admitting: *Deleted

## 2017-05-30 NOTE — Telephone Encounter (Signed)
  Follow up Call-  Call back number 05/29/2017  Post procedure Call Back phone  # [phone]  Permission to leave phone message Yes  Some recent data might be hidden     Patient questions:  Do you have a fever, pain , or abdominal swelling? No. Pain Score  0 *  Have you tolerated food without any problems? Yes.    Have you been able to return to your normal activities? Yes.    Do you have any questions about your discharge instructions: Diet   No. Medications  No. Follow up visit  No.  Do you have questions or concerns about your Care? No.  Actions: * If pain score is 4 or above: No action needed, pain <4.

## 2017-06-04 ENCOUNTER — Encounter: Payer: Self-pay | Admitting: Internal Medicine

## 2017-09-26 DIAGNOSIS — I10 Essential (primary) hypertension: Secondary | ICD-10-CM | POA: Diagnosis not present

## 2017-09-26 DIAGNOSIS — R109 Unspecified abdominal pain: Secondary | ICD-10-CM | POA: Diagnosis not present

## 2017-09-26 DIAGNOSIS — D509 Iron deficiency anemia, unspecified: Secondary | ICD-10-CM | POA: Diagnosis not present

## 2017-09-26 DIAGNOSIS — R42 Dizziness and giddiness: Secondary | ICD-10-CM | POA: Diagnosis not present

## 2017-10-01 ENCOUNTER — Encounter: Payer: Self-pay | Admitting: Neurology

## 2017-10-11 DIAGNOSIS — R42 Dizziness and giddiness: Secondary | ICD-10-CM | POA: Diagnosis not present

## 2017-10-11 DIAGNOSIS — R0789 Other chest pain: Secondary | ICD-10-CM | POA: Diagnosis not present

## 2017-11-01 ENCOUNTER — Other Ambulatory Visit: Payer: Self-pay | Admitting: Family Medicine

## 2017-11-01 DIAGNOSIS — Z1231 Encounter for screening mammogram for malignant neoplasm of breast: Secondary | ICD-10-CM

## 2017-11-12 DIAGNOSIS — H40033 Anatomical narrow angle, bilateral: Secondary | ICD-10-CM | POA: Diagnosis not present

## 2017-11-12 DIAGNOSIS — H2513 Age-related nuclear cataract, bilateral: Secondary | ICD-10-CM | POA: Diagnosis not present

## 2017-11-15 DIAGNOSIS — G43109 Migraine with aura, not intractable, without status migrainosus: Secondary | ICD-10-CM | POA: Diagnosis not present

## 2017-11-19 DIAGNOSIS — R1310 Dysphagia, unspecified: Secondary | ICD-10-CM | POA: Diagnosis not present

## 2017-11-19 DIAGNOSIS — Z713 Dietary counseling and surveillance: Secondary | ICD-10-CM | POA: Diagnosis not present

## 2017-11-19 DIAGNOSIS — R112 Nausea with vomiting, unspecified: Secondary | ICD-10-CM | POA: Diagnosis not present

## 2017-11-19 DIAGNOSIS — R1013 Epigastric pain: Secondary | ICD-10-CM | POA: Diagnosis not present

## 2017-11-22 ENCOUNTER — Other Ambulatory Visit: Payer: Self-pay | Admitting: Neurology

## 2017-11-22 ENCOUNTER — Encounter: Payer: Self-pay | Admitting: *Deleted

## 2017-11-22 ENCOUNTER — Ambulatory Visit (INDEPENDENT_AMBULATORY_CARE_PROVIDER_SITE_OTHER): Payer: 59 | Admitting: Neurology

## 2017-11-22 ENCOUNTER — Telehealth: Payer: Self-pay | Admitting: Neurology

## 2017-11-22 ENCOUNTER — Encounter: Payer: Self-pay | Admitting: Neurology

## 2017-11-22 VITALS — Ht 67.0 in | Wt 242.4 lb

## 2017-11-22 DIAGNOSIS — R51 Headache with orthostatic component, not elsewhere classified: Secondary | ICD-10-CM

## 2017-11-22 DIAGNOSIS — G43109 Migraine with aura, not intractable, without status migrainosus: Secondary | ICD-10-CM

## 2017-11-22 DIAGNOSIS — G43809 Other migraine, not intractable, without status migrainosus: Secondary | ICD-10-CM

## 2017-11-22 DIAGNOSIS — G3281 Cerebellar ataxia in diseases classified elsewhere: Secondary | ICD-10-CM

## 2017-11-22 DIAGNOSIS — R42 Dizziness and giddiness: Secondary | ICD-10-CM

## 2017-11-22 DIAGNOSIS — R519 Headache, unspecified: Secondary | ICD-10-CM

## 2017-11-22 DIAGNOSIS — R0683 Snoring: Secondary | ICD-10-CM | POA: Diagnosis not present

## 2017-11-22 DIAGNOSIS — W19XXXA Unspecified fall, initial encounter: Secondary | ICD-10-CM

## 2017-11-22 DIAGNOSIS — R270 Ataxia, unspecified: Secondary | ICD-10-CM

## 2017-11-22 DIAGNOSIS — Z79899 Other long term (current) drug therapy: Secondary | ICD-10-CM

## 2017-11-22 MED ORDER — TOPIRAMATE 100 MG PO TABS: 100.0000 mg | ORAL_TABLET | Freq: Every day | ORAL | 11 refills | Status: AC

## 2017-11-22 MED ORDER — ALPRAZOLAM 0.25 MG PO TABS: ORAL_TABLET | ORAL | 0 refills | Status: DC

## 2017-11-22 NOTE — Telephone Encounter (Signed)
Patient is schedule to have her MRI done on 11/28/17 at the [facility] mobile unit. She informed me that she is claustrophobic and would like something to calm her nerves.

## 2017-11-22 NOTE — Telephone Encounter (Signed)
Xanax
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
## 2017-11-22 NOTE — Patient Instructions (Addendum)
Increase Topiramate to 100mg  a day Start taking the Zofran (ondansetron) as prescribed that helps with migranes, dizziness and nausea Vestibular Therapy Mri brain w/wo contrat Sleep evaluation Start Lexapro, this is also a good migraine medication  Vertigo Vertigo is the feeling that you or your surroundings are moving when they are not. Vertigo can be dangerous if it occurs while you are doing something that could endanger you or others, such as driving. What are the causes? This condition is caused by a disturbance in the signals that are sent by your body's sensory systems to your brain. Different causes of a disturbance can lead to vertigo, including:  Infections, especially in the inner ear.  A bad reaction to a drug, or misuse of alcohol and medicines.  Withdrawal from drugs or alcohol.  Quickly changing positions, as when lying down or rolling over in bed.  Migraine headaches.  Decreased blood flow to the brain.  Decreased blood pressure.  Increased pressure in the brain from a head or neck injury, stroke, infection, tumor, or bleeding.  Central nervous system disorders.  What are the signs or symptoms? Symptoms of this condition usually occur when you move your head or your eyes in different directions. Symptoms may start suddenly, and they usually last for less than a minute. Symptoms may include:  Loss of balance and falling.  Feeling like you are spinning or moving.  Feeling like your surroundings are spinning or moving.  Nausea and vomiting.  Blurred vision or double vision.  Difficulty hearing.  Slurred speech.  Dizziness.  Involuntary eye movement (nystagmus).  Symptoms can be mild and cause only slight annoyance, or they can be severe and interfere with daily life. Episodes of vertigo may return (recur) over time, and they are often triggered by certain movements. Symptoms may improve over time. How is this diagnosed? This condition may be diagnosed  based on medical history and the quality of your nystagmus. Your health care provider may test your eye movements by asking you to quickly change positions to trigger the nystagmus. This may be called the Dix-Hallpike test, head thrust test, or roll test. You may be referred to a health care provider who specializes in ear, nose, and throat (ENT) problems (otolaryngologist) or a provider who specializes in disorders of the central nervous system (neurologist). You may have additional testing, including:  A physical exam.  Blood tests.  MRI.  A CT scan.  An electrocardiogram (ECG). This records electrical activity in your heart.  An electroencephalogram (EEG). This records electrical activity in your brain.  Hearing tests.  How is this treated? Treatment for this condition depends on the cause and the severity of the symptoms. Treatment options include:  Medicines to treat nausea or vertigo. These are usually used for severe cases. Some medicines that are used to treat other conditions may also reduce or eliminate vertigo symptoms. These include: ? Medicines that control allergies (antihistamines). ? Medicines that control seizures (anticonvulsants). ? Medicines that relieve depression (antidepressants). ? Medicines that relieve anxiety (sedatives).  Head movements to adjust your inner ear back to normal. If your vertigo is caused by an ear problem, your health care provider may recommend certain movements to correct the problem.  Surgery. This is rare.  Follow these instructions at home: Safety  Move slowly.Avoid sudden body or head movements.  Avoid driving.  Avoid operating heavy machinery.  Avoid doing any tasks that would cause danger to you or others if you would have a vertigo episode during  the task.  If you have trouble walking or keeping your balance, try using a cane for stability. If you feel dizzy or unstable, sit down right away.  Return to your normal  activities as told by your health care provider. Ask your health care provider what activities are safe for you. General instructions  Take over-the-counter and prescription medicines only as told by your health care provider.  Avoid certain positions or movements as told by your health care provider.  Drink enough fluid to keep your urine clear or pale yellow.  Keep all follow-up visits as told by your health care provider. This is important. Contact a health care provider if:  Your medicines do not relieve your vertigo or they make it worse.  You have a fever.  Your condition gets worse or you develop new symptoms.  Your family or friends notice any behavioral changes.  Your nausea or vomiting gets worse.  You have numbness or a "pins and needles" sensation in part of your body. Get help right away if:  You have difficulty moving or speaking.  You are always dizzy.  You faint.  You develop severe headaches.  You have weakness in your hands, arms, or legs.  You have changes in your hearing or vision.  You develop a stiff neck.  You develop sensitivity to light. This information is not intended to replace advice given to you by your health care provider. Make sure you discuss any questions you have with your health care provider. Document Released: 08/09/2005 Document Revised: 04/12/2016 Document Reviewed: 02/22/2015 Elsevier Interactive Patient Education  [employer].

## 2017-11-22 NOTE — Telephone Encounter (Signed)
Done on youd esk thanks

## 2017-11-23 ENCOUNTER — Encounter (HOSPITAL_COMMUNITY): Payer: Self-pay | Admitting: Emergency Medicine

## 2017-11-23 ENCOUNTER — Emergency Department (HOSPITAL_COMMUNITY)
Admission: EM | Admit: 2017-11-23 | Discharge: 2017-11-23 | Discharge status: Against Medical Advice | Payer: 59 | Attending: Emergency Medicine | Admitting: Emergency Medicine

## 2017-11-23 ENCOUNTER — Emergency Department (HOSPITAL_COMMUNITY): Payer: 59

## 2017-11-23 DIAGNOSIS — Z5321 Procedure and treatment not carried out due to patient leaving prior to being seen by health care provider: Secondary | ICD-10-CM | POA: Diagnosis not present

## 2017-11-23 DIAGNOSIS — R0789 Other chest pain: Secondary | ICD-10-CM | POA: Diagnosis not present

## 2017-11-23 DIAGNOSIS — R42 Dizziness and giddiness: Secondary | ICD-10-CM | POA: Insufficient documentation

## 2017-11-23 LAB — COMPREHENSIVE METABOLIC PANEL
A/G RATIO: 1.1 — AB (ref 1.2–2.2)
ALT: 11 IU/L (ref 0–32)
AST: 20 IU/L (ref 0–40)
Albumin: 4.1 g/dL (ref 3.5–5.5)
Alkaline Phosphatase: 79 IU/L (ref 39–117)
BUN/Creatinine Ratio: 21 (ref 9–23)
BUN: 20 mg/dL (ref 6–24)
Bilirubin Total: 0.4 mg/dL (ref 0.0–1.2)
CALCIUM: 9.7 mg/dL (ref 8.7–10.2)
CO2: 26 mmol/L (ref 20–29)
CREATININE: 0.97 mg/dL (ref 0.57–1.00)
Chloride: 102 mmol/L (ref 96–106)
GFR, EST AFRICAN AMERICAN: 77 mL/min/{1.73_m2} (ref >59)
GFR, EST NON AFRICAN AMERICAN: 67 mL/min/{1.73_m2} (ref >59)
Globulin, Total: 3.7 g/dL (ref 1.5–4.5)
Glucose: 92 mg/dL (ref 65–99)
POTASSIUM: 4.4 mmol/L (ref 3.5–5.2)
Sodium: 142 mmol/L (ref 134–144)
TOTAL PROTEIN: 7.8 g/dL (ref 6.0–8.5)

## 2017-11-23 LAB — CBC
HEMOGLOBIN: 14 g/dL (ref 11.1–15.9)
Hematocrit: 43 % (ref 34.0–46.6)
MCH: 28.1 pg (ref 26.6–33.0)
MCHC: 32.6 g/dL (ref 31.5–35.7)
MCV: 86 fL (ref 79–97)
Platelets: 316 10*3/uL (ref 150–379)
RBC: 4.99 x10E6/uL (ref 3.77–5.28)
RDW: 14.3 % (ref 12.3–15.4)
WBC: 6.9 10*3/uL (ref 3.4–10.8)

## 2017-11-23 LAB — I-STAT TROPONIN, ED: TROPONIN I, POC: 0 ng/mL (ref 0.00–0.08)

## 2017-11-23 LAB — TSH: TSH: 1.98 u[IU]/mL (ref 0.450–4.500)

## 2017-11-23 LAB — TOPIRAMATE LEVEL: TOPIRAMATE LVL: 3.3 ug/mL (ref 2.0–25.0)

## 2017-11-23 LAB — I-STAT BETA HCG BLOOD, ED (MC, WL, AP ONLY): I-stat hCG, quantitative: <5 m[IU]/mL (ref <5)

## 2017-11-23 NOTE — Telephone Encounter (Signed)
Faxed Xanax prescription to pharmacy. Received a receipt of confirmation.

## 2017-11-23 NOTE — ED Triage Notes (Signed)
Patient reports been having issues with vertigo since March 2018. Been having issues with anxiety with vertigo gets really bad for past 3 months. Patient brought in by her supervisor due to looking pale today.

## 2017-11-25 DIAGNOSIS — G43109 Migraine with aura, not intractable, without status migrainosus: Secondary | ICD-10-CM

## 2017-11-25 DIAGNOSIS — G43809 Other migraine, not intractable, without status migrainosus: Secondary | ICD-10-CM | POA: Insufficient documentation

## 2017-11-27 ENCOUNTER — Telehealth: Payer: Self-pay | Admitting: *Deleted

## 2017-11-27 NOTE — Telephone Encounter (Signed)
Per Dr. Jaynee Eagles, labs unremarkable/.  I called the patient and LVM (ok per DPR) informing her that her labs are unremarkable. I informed patient a call back is not required but encouraged pt to call with any questions. Left office number on voicemail.

## 2017-11-28 ENCOUNTER — Ambulatory Visit: Payer: 59

## 2017-11-28 DIAGNOSIS — R51 Headache with orthostatic component, not elsewhere classified: Secondary | ICD-10-CM

## 2017-11-28 DIAGNOSIS — R519 Headache, unspecified: Secondary | ICD-10-CM

## 2017-11-28 DIAGNOSIS — R42 Dizziness and giddiness: Secondary | ICD-10-CM

## 2017-11-28 DIAGNOSIS — R270 Ataxia, unspecified: Secondary | ICD-10-CM | POA: Diagnosis not present

## 2017-11-28 DIAGNOSIS — G3281 Cerebellar ataxia in diseases classified elsewhere: Secondary | ICD-10-CM | POA: Diagnosis not present

## 2017-11-28 DIAGNOSIS — W19XXXA Unspecified fall, initial encounter: Secondary | ICD-10-CM

## 2017-11-28 MED ORDER — GADOPENTETATE DIMEGLUMINE 469.01 MG/ML IV SOLN: 20.0000 mL | Freq: Once | INTRAVENOUS | Status: AC | PRN

## 2017-11-29 ENCOUNTER — Ambulatory Visit
Admission: RE | Admit: 2017-11-29 | Discharge: 2017-11-29 | Disposition: A | Discharge status: Home / Self Care | Payer: 59 | Source: Ambulatory Visit | Attending: Family Medicine | Admitting: Family Medicine

## 2017-11-29 DIAGNOSIS — Z1231 Encounter for screening mammogram for malignant neoplasm of breast: Secondary | ICD-10-CM | POA: Diagnosis not present

## 2017-11-30 ENCOUNTER — Telehealth: Payer: Self-pay | Admitting: *Deleted

## 2017-11-30 NOTE — Telephone Encounter (Signed)
Called and LVM informing patient that her MRI brain is normal. I encouraged the patient to call with any questions but that a return call was not required. Left office number in voicemail.

## 2017-11-30 NOTE — Telephone Encounter (Signed)
-----   Message from Melvenia Beam, MD sent at 11/30/2017  9:02 AM EST ----- MRI brain normal

## 2017-12-11 ENCOUNTER — Ambulatory Visit: Payer: 59 | Admitting: Physical Therapy

## 2017-12-19 ENCOUNTER — Telehealth: Payer: Self-pay

## 2017-12-19 ENCOUNTER — Institutional Professional Consult (permissible substitution): Payer: 59 | Admitting: Neurology

## 2017-12-19 NOTE — Telephone Encounter (Signed)
Pt did not show for their appt with Dr. Athar today.  

## 2017-12-20 ENCOUNTER — Encounter: Payer: Self-pay | Admitting: Neurology

## 2018-01-02 DIAGNOSIS — R110 Nausea: Secondary | ICD-10-CM | POA: Diagnosis not present

## 2018-01-02 DIAGNOSIS — Z9884 Bariatric surgery status: Secondary | ICD-10-CM | POA: Diagnosis not present

## 2018-01-02 DIAGNOSIS — R109 Unspecified abdominal pain: Secondary | ICD-10-CM | POA: Diagnosis not present

## 2018-01-02 DIAGNOSIS — R1013 Epigastric pain: Secondary | ICD-10-CM | POA: Diagnosis not present

## 2018-01-17 ENCOUNTER — Ambulatory Visit: Payer: 59 | Admitting: Neurology

## 2018-01-21 ENCOUNTER — Telehealth: Payer: Self-pay | Admitting: Neurology

## 2018-01-21 ENCOUNTER — Encounter: Payer: Self-pay | Admitting: *Deleted

## 2018-01-21 ENCOUNTER — Ambulatory Visit: Payer: 59 | Admitting: Neurology

## 2018-01-21 ENCOUNTER — Encounter: Payer: Self-pay | Admitting: Neurology

## 2018-01-21 VITALS — BP 131/85 | HR 88 | Ht 67.0 in | Wt 248.0 lb

## 2018-01-21 DIAGNOSIS — G43711 Chronic migraine without aura, intractable, with status migrainosus: Secondary | ICD-10-CM | POA: Diagnosis not present

## 2018-01-21 MED ORDER — ERENUMAB-AOOE 70 MG/ML ~~LOC~~ SOAJ: 140.0000 mg | SUBCUTANEOUS | 11 refills | Status: DC
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
## 2018-01-21 NOTE — Telephone Encounter (Signed)
Patient needs call to schedule sleep consultation.

## 2018-01-21 NOTE — Patient Instructions (Signed)
Erenumab: Patient drug information [employer] here. Copyright [phone] [employer] All rights reserved. (For additional information see "Erenumab: Drug information") Brand Names: [place]  Aimovig;  Aimovig 140 Dose  What is this drug used for?   It is used to prevent migraine headaches.  What do I need to tell my doctor BEFORE I take this drug?   If you have an allergy to this drug or any part of this drug.   If you are allergic to any drugs like this one, any other drugs, foods, or other substances. Tell your doctor about the allergy and what signs you had, like rash; hives; itching; shortness of breath; wheezing; cough; swelling of face, lips, tongue, or throat; or any other signs.   This drug may interact with other drugs or health problems.   Tell your doctor and pharmacist about all of your drugs (prescription or OTC, natural products, vitamins) and health problems. You must check to make sure that it is safe for you to take this drug with all of your drugs and health problems. Do not start, stop, or change the dose of any drug without checking with your doctor.  What are some things I need to know or do while I take this drug?   Tell all of your health care providers that you take this drug. This includes your doctors, nurses, pharmacists, and dentists.   If you have a latex allergy, talk with your doctor.   Tell your doctor if you are pregnant or plan on getting pregnant. You will need to talk about the benefits and risks of using this drug while you are pregnant.   Tell your doctor if you are breast-feeding. You will need to talk about any risks to your baby.  What are some side effects that I need to call my doctor about right away?   WARNING/CAUTION: Even though it may be rare, some people may have very bad and sometimes deadly side effects when taking a drug. Tell your doctor or get medical help right away if you have any of the following signs or symptoms that may be  related to a very bad side effect:   Signs of an allergic reaction, like rash; hives; itching; red, swollen, blistered, or peeling skin with or without fever; wheezing; tightness in the chest or throat; trouble breathing, swallowing, or talking; unusual hoarseness; or swelling of the mouth, face, lips, tongue, or throat.  What are some other side effects of this drug?   All drugs may cause side effects. However, many people have no side effects or only have minor side effects. Call your doctor or get medical help if any of these side effects or any other side effects bother you or do not go away:   Redness or swelling where the shot is given.   Pain where the shot was given.   Constipation.   These are not all of the side effects that may occur. If you have questions about side effects, call your doctor. Call your doctor for medical advice about side effects.   You may report side effects to your national health agency.  How is this drug best taken?   Use this drug as ordered by your doctor. Read all information given to you. Follow all instructions closely.   It is given as a shot into the fatty part of the skin on the top of the thigh, belly area, or upper arm.   If you will be giving yourself the  shot, your doctor or nurse will teach you how to give the shot.   Follow how to use as you have been told by the doctor or read the package insert.   If stored in a refrigerator, let this drug come to room temperature before using it. Leave it at room temperature for at least 30 minutes. Do not heat this drug.   Protect from heat and sunlight.   Do not shake.   Do not give into skin that is irritated, bruised, red, infected, or scarred.   Do not use if the solution is cloudy, leaking, or has particles.   Do not use if solution changes color.   Throw away after using. Do not use the device more than 1 time.   Throw away needles in a needle/sharp disposal box. Do not reuse needles or other items.  When the box is full, follow all local rules for getting rid of it. Talk with a doctor or pharmacist if you have any questions.  What do I do if I miss a dose?   Take a missed dose as soon as you think about it.   After taking a missed dose, start a new schedule based on when the dose is taken.  How do I store and/or throw out this drug?   Store in a refrigerator. Do not freeze.   Store in the carton to protect from light.   Do not use if it has been frozen.   If you drop this drug on a hard surface, do not use it.   If needed, you may store at room temperature for up to 7 days. Write down the date you take this drug out of the refrigerator. If stored at room temperature and not used within 7 days, throw this drug away.   Do not put this drug back in the refrigerator after it has been stored at room temperature.   Keep all drugs in a safe place. Keep all drugs out of the reach of children and pets.   Throw away unused or expired drugs. Do not flush down a toilet or pour down a drain unless you are told to do so. Check with your pharmacist if you have questions about the best way to throw out drugs. There may be drug take-back programs in your area.  General drug facts   If your symptoms or health problems do not get better or if they become worse, call your doctor.   Do not share your drugs with others and do not take anyone else's drugs.   Keep a list of all your drugs (prescription, natural products, vitamins, OTC) with you. Give this list to your doctor.   Talk with the doctor before starting any new drug, including prescription or OTC, natural products, or vitamins.   Some drugs may have another patient information leaflet. If you have any questions about this drug, please talk with your doctor, nurse, pharmacist, or other health care provider.   If you think there has been an overdose, call your poison control center or get medical care right away. Be ready to tell or show what was taken,  how much, and when it happened.

## 2018-01-24 ENCOUNTER — Telehealth: Payer: Self-pay | Admitting: *Deleted

## 2018-01-24 NOTE — Telephone Encounter (Addendum)
Received notification from [employer] stating that [name] has been denied due to patient not meeting requirements.

## 2018-01-24 NOTE — Telephone Encounter (Signed)
Completed PA on Cover My Meds  KEY: [id number]  Awaiting electronic determination within 72 hours.

## 2018-01-28 DIAGNOSIS — K208 Other esophagitis: Secondary | ICD-10-CM | POA: Diagnosis not present

## 2018-01-28 DIAGNOSIS — R1013 Epigastric pain: Secondary | ICD-10-CM | POA: Diagnosis not present

## 2018-01-28 DIAGNOSIS — K287 Chronic gastrojejunal ulcer without hemorrhage or perforation: Secondary | ICD-10-CM | POA: Diagnosis not present

## 2018-01-28 DIAGNOSIS — K219 Gastro-esophageal reflux disease without esophagitis: Secondary | ICD-10-CM | POA: Diagnosis not present

## 2018-01-28 DIAGNOSIS — K289 Gastrojejunal ulcer, unspecified as acute or chronic, without hemorrhage or perforation: Secondary | ICD-10-CM | POA: Diagnosis not present

## 2018-01-28 DIAGNOSIS — K209 Esophagitis, unspecified: Secondary | ICD-10-CM | POA: Diagnosis not present

## 2018-01-28 DIAGNOSIS — K2270 Barrett's esophagus without dysplasia: Secondary | ICD-10-CM | POA: Diagnosis not present

## 2018-01-29 DIAGNOSIS — R1013 Epigastric pain: Secondary | ICD-10-CM | POA: Diagnosis not present

## 2018-01-29 NOTE — Telephone Encounter (Signed)
Spoke with patient. She stated that she activated her access card online. She received it in the mail and took it to her pharmacy. She was told that insurance has to cover some of the medication. RN discussed with patient that the card has two options. One is that if insurance approves the medication is $5. The other is that if insurance does not approve or if a PA is required, the medication will be free for a year. Pt is aware that PA was done and was unfortunately denied, however card should still work. Pt was given the aimovig access hotline to call and will be in touch with them as well as her pharmacy. Pt will call the office tomorrow if there is no resolution. She stated she is still having bad headaches. RN informed pt that she will make Dr. Jaynee Eagles aware.   Called pharmacy and spoke with pharmacist. They had just gotten the card to go through and stated that the patient was on another line. They will order the Aimovig and get it in for the patient tomorrow.

## 2018-01-29 NOTE — Telephone Encounter (Signed)
Pt requesting a call to discuss the PA for Aimovig, stating she is needing something to help with her headaches and is wanting to know if something else can be prescribed, please call to advise

## 2018-02-05 DIAGNOSIS — K8020 Calculus of gallbladder without cholecystitis without obstruction: Secondary | ICD-10-CM | POA: Diagnosis not present

## 2018-02-28 DIAGNOSIS — R1013 Epigastric pain: Secondary | ICD-10-CM | POA: Diagnosis not present

## 2018-02-28 DIAGNOSIS — K8070 Calculus of gallbladder and bile duct without cholecystitis without obstruction: Secondary | ICD-10-CM | POA: Diagnosis not present

## 2018-03-04 DIAGNOSIS — K8020 Calculus of gallbladder without cholecystitis without obstruction: Secondary | ICD-10-CM | POA: Diagnosis not present

## 2018-03-04 DIAGNOSIS — G4733 Obstructive sleep apnea (adult) (pediatric): Secondary | ICD-10-CM | POA: Diagnosis not present

## 2018-03-04 DIAGNOSIS — K811 Chronic cholecystitis: Secondary | ICD-10-CM | POA: Diagnosis not present

## 2018-03-04 DIAGNOSIS — Z9884 Bariatric surgery status: Secondary | ICD-10-CM | POA: Diagnosis not present

## 2018-05-28 DIAGNOSIS — Z9884 Bariatric surgery status: Secondary | ICD-10-CM | POA: Diagnosis not present

## 2018-06-14 DIAGNOSIS — T182XXA Foreign body in stomach, initial encounter: Secondary | ICD-10-CM | POA: Diagnosis not present

## 2018-06-14 DIAGNOSIS — K289 Gastrojejunal ulcer, unspecified as acute or chronic, without hemorrhage or perforation: Secondary | ICD-10-CM | POA: Diagnosis not present

## 2018-06-14 DIAGNOSIS — K281 Acute gastrojejunal ulcer with perforation: Secondary | ICD-10-CM | POA: Diagnosis not present

## 2018-06-14 DIAGNOSIS — K259 Gastric ulcer, unspecified as acute or chronic, without hemorrhage or perforation: Secondary | ICD-10-CM | POA: Diagnosis not present

## 2018-06-14 DIAGNOSIS — R109 Unspecified abdominal pain: Secondary | ICD-10-CM | POA: Diagnosis not present

## 2018-07-24 DIAGNOSIS — K287 Chronic gastrojejunal ulcer without hemorrhage or perforation: Secondary | ICD-10-CM | POA: Diagnosis not present

## 2018-07-24 DIAGNOSIS — I10 Essential (primary) hypertension: Secondary | ICD-10-CM | POA: Diagnosis not present

## 2018-07-24 DIAGNOSIS — K219 Gastro-esophageal reflux disease without esophagitis: Secondary | ICD-10-CM | POA: Diagnosis not present

## 2018-08-01 ENCOUNTER — Ambulatory Visit: Payer: 59 | Admitting: Adult Health

## 2018-08-06 DIAGNOSIS — Z01812 Encounter for preprocedural laboratory examination: Secondary | ICD-10-CM | POA: Diagnosis not present

## 2018-08-06 DIAGNOSIS — Z112 Encounter for screening for other bacterial diseases: Secondary | ICD-10-CM | POA: Diagnosis not present

## 2018-08-06 DIAGNOSIS — Z01818 Encounter for other preprocedural examination: Secondary | ICD-10-CM | POA: Diagnosis not present

## 2018-08-22 DIAGNOSIS — Z6841 Body Mass Index (BMI) 40.0 and over, adult: Secondary | ICD-10-CM | POA: Diagnosis not present

## 2018-08-22 DIAGNOSIS — K287 Chronic gastrojejunal ulcer without hemorrhage or perforation: Secondary | ICD-10-CM | POA: Diagnosis not present

## 2018-08-26 DIAGNOSIS — K660 Peritoneal adhesions (postprocedural) (postinfection): Secondary | ICD-10-CM | POA: Diagnosis not present

## 2018-08-26 DIAGNOSIS — K287 Chronic gastrojejunal ulcer without hemorrhage or perforation: Secondary | ICD-10-CM | POA: Diagnosis not present

## 2018-08-26 DIAGNOSIS — R1013 Epigastric pain: Secondary | ICD-10-CM | POA: Diagnosis not present

## 2018-08-26 DIAGNOSIS — K219 Gastro-esophageal reflux disease without esophagitis: Secondary | ICD-10-CM | POA: Diagnosis not present

## 2018-08-26 DIAGNOSIS — Z6841 Body Mass Index (BMI) 40.0 and over, adult: Secondary | ICD-10-CM | POA: Diagnosis not present

## 2018-09-25 DIAGNOSIS — K287 Chronic gastrojejunal ulcer without hemorrhage or perforation: Secondary | ICD-10-CM | POA: Diagnosis not present

## 2018-09-25 DIAGNOSIS — Z713 Dietary counseling and surveillance: Secondary | ICD-10-CM | POA: Diagnosis not present

## 2018-09-25 DIAGNOSIS — Z6841 Body Mass Index (BMI) 40.0 and over, adult: Secondary | ICD-10-CM | POA: Diagnosis not present

## 2018-11-08 ENCOUNTER — Other Ambulatory Visit: Payer: Self-pay | Admitting: Family Medicine

## 2018-11-08 DIAGNOSIS — Z1231 Encounter for screening mammogram for malignant neoplasm of breast: Secondary | ICD-10-CM

## 2018-12-10 ENCOUNTER — Ambulatory Visit
Admission: RE | Admit: 2018-12-10 | Discharge: 2018-12-10 | Disposition: A | Discharge status: Home / Self Care | Payer: 59 | Source: Ambulatory Visit | Attending: Family Medicine | Admitting: Family Medicine

## 2018-12-10 DIAGNOSIS — Z1231 Encounter for screening mammogram for malignant neoplasm of breast: Secondary | ICD-10-CM | POA: Diagnosis not present

## 2018-12-23 ENCOUNTER — Ambulatory Visit (INDEPENDENT_AMBULATORY_CARE_PROVIDER_SITE_OTHER): Payer: 59 | Admitting: Obstetrics & Gynecology

## 2018-12-23 ENCOUNTER — Encounter: Payer: Self-pay | Admitting: Family Medicine

## 2018-12-23 ENCOUNTER — Encounter: Payer: Self-pay | Admitting: Obstetrics & Gynecology

## 2018-12-23 VITALS — BP 167/111 | HR 80 | Ht 67.0 in | Wt 244.6 lb

## 2018-12-23 DIAGNOSIS — Z86718 Personal history of other venous thrombosis and embolism: Secondary | ICD-10-CM

## 2018-12-23 DIAGNOSIS — Z01419 Encounter for gynecological examination (general) (routine) without abnormal findings: Secondary | ICD-10-CM | POA: Diagnosis not present

## 2018-12-23 DIAGNOSIS — Z30431 Encounter for routine checking of intrauterine contraceptive device: Secondary | ICD-10-CM | POA: Diagnosis not present

## 2018-12-23 DIAGNOSIS — Z1151 Encounter for screening for human papillomavirus (HPV): Secondary | ICD-10-CM

## 2018-12-23 DIAGNOSIS — Z124 Encounter for screening for malignant neoplasm of cervix: Secondary | ICD-10-CM

## 2018-12-23 NOTE — Progress Notes (Signed)
Patient ID: [name], female   DOB: 08/01/1966, 53 y.o.   MRN: [id number]  Chief Complaint  Patient presents with  . Gynecologic Exam  string check  HPI [name] is a 53 y.o. female.  [id number] No LMP recorded. (Menstrual status: IUD). She has significant VMS at night and no menses with Liletta in place. H/O DVT in 2008 while on OCP. HPI  Past Medical History:  Diagnosis Date  . Arthritis   . Carpal tunnel syndrome   . Chest pain 03/07/2017  . Complication of anesthesia    " it makes me hallucinate "  . Deep vein thrombosis (DVT) ([place])   . Hypertension   . Obesity   . Pulmonary embolism ([place])   . Vitamin D deficiency     Past Surgical History:  Procedure Laterality Date  . CARPAL TUNNEL RELEASE Left   . CESAREAN SECTION     2 previous c-sections, [place]  . GASTRIC BYPASS  06/2014  . GASTRIC BYPASS  2015  . KNEE SURGERY Left   . LEFT HEART CATH  2012  . TUBAL LIGATION      Family History  Problem Relation Age of Onset  . Hypertension Mother   . Breast cancer Mother        unsure of age  . Heart disease Mother   . COPD Mother   . Hypertension Father   . Heart disease Father   . COPD Father   . Diabetes Paternal Aunt   . Diabetes Paternal Uncle   . Breast cancer Maternal Aunt        unsure of age  . Colon cancer Neg Hx   . Esophageal cancer Neg Hx   . Rectal cancer Neg Hx   . Stomach cancer Neg Hx     Social History Social History   Tobacco Use  . Smoking status: Never Smoker  . Smokeless tobacco: Never Used  Substance Use Topics  . Alcohol use: No  . Drug use: No    Allergies  Allergen Reactions  . Darvocet [Propoxyphene N-Acetaminophen] Other (See Comments)    Some kind of reaction during surgery  . Other Other (See Comments)  . Oxycodone-Acetaminophen Itching    Does not remember  . Percocet [Oxycodone-Acetaminophen] Other (See Comments)    Does not remember  . Vicodin [Hydrocodone-Acetaminophen] Other (See Comments)   Heart to flutter  . Hydrocodone-Acetaminophen Palpitations    Heart to flutter    Current Outpatient Medications  Medication Sig Dispense Refill  . Biotin 10000 MCG TABS Take 1 capsule by mouth daily.    . Calcium Citrate 200 MG TABS Take 200 mg by mouth daily.     . Cholecalciferol (VITAMIN D PO) Take 1 tablet by mouth daily.    . Cyanocobalamin (RA VITAMIN B-12 TR) 1000 MCG TBCR Take 1,000 mcg by mouth daily.     Eduard Roux (AIMOVIG 140 DOSE) 70 MG/ML SOAJ Inject 140 mg into the skin every 30 (thirty) days. 2 pen 11  . metoprolol succinate (TOPROL-XL) 25 MG 24 hr tablet Take 1 tablet (25 mg total) by mouth daily. 30 tablet 0  . Multiple Vitamin (MULTIVITAMIN) capsule Take 1 capsule by mouth 3 (three) times daily.     . nitroGLYCERIN (NITROSTAT) 0.4 MG SL tablet Place 1 tablet (0.4 mg total) under the tongue every 5 (five) minutes as needed for chest pain. 25 tablet 0  . pantoprazole (PROTONIX) 40 MG tablet Take 1 tablet (40 mg total) by  mouth 2 (two) times daily. 30 tablet 0  . aspirin EC 81 MG EC tablet Take 1 tablet (81 mg total) by mouth daily. 30 tablet 0  . dicyclomine (BENTYL) 20 MG tablet Take 20 mg by mouth 4 (four) times daily.    [name] escitalopram (LEXAPRO) 10 MG tablet Take 10 mg by mouth daily.    . ferrous sulfate 325 (65 FE) MG tablet Take 325 mg by mouth daily with breakfast.    . hydrochlorothiazide (HYDRODIURIL) 25 MG tablet Take 25 mg by mouth daily.    . phentermine 30 MG capsule Take 30 mg by mouth daily.     [name] topiramate (TOPAMAX) 100 MG tablet Take 1 tablet (100 mg total) by mouth at bedtime. 30 tablet 11   No current facility-administered medications for this visit.    Facility-Administered Medications Ordered in Other Visits  Medication Dose Route Frequency Provider Last Rate Last Dose  . gadopentetate dimeglumine (MAGNEVIST) injection 20 mL  20 mL Intravenous Once PRN Melvenia Beam, MD        Review of Systems Review of Systems  Gastrointestinal:  Negative.   Endocrine:       Hot flushes and night sweats  Genitourinary: Negative.   Psychiatric/Behavioral: Positive for sleep disturbance (due to VMS).    Blood pressure (!) 167/111, pulse 80, height 5\' 7"  (1.702 m), weight 244 lb 9.6 oz (110.9 kg).  Physical Exam Physical Exam Constitutional:      Appearance: She is not ill-appearing.  Neck:     Musculoskeletal: Normal range of motion.  Cardiovascular:     Rate and Rhythm: Normal rate.  Pulmonary:     Effort: Pulmonary effort is normal.  Neurological:     Mental Status: She is alert.     Data Reviewed Pap results  Assessment    Normal f/u with IUD in place for menstrual difficulty.  No menstrual bleeding with menopausal symptoms H/o DVT, advised previously to not take systemic HRT    Plan    Yearly f/u She is up to date with pap and mammogram Urged compliance with BP medication and f/u with PCP soon Lexapro reordered to see if her menopausal sx respond as she is not a candidate for estrogen therapy       [name] 12/23/2018, 9:42 AM

## 2018-12-23 NOTE — Patient Instructions (Signed)
Menopause Menopause is the normal time of life when menstrual periods stop completely. It is usually confirmed by 12 months without a menstrual period. The transition to menopause (perimenopause) most often happens between the ages of 45 and 55. During perimenopause, hormone levels change in your body, which can cause symptoms and affect your health. Menopause may increase your risk for:  Loss of bone (osteoporosis), which causes bone breaks (fractures).  Depression.  Hardening and narrowing of the arteries (atherosclerosis), which can cause heart attacks and strokes. What are the causes? This condition is usually caused by a natural change in hormone levels that happens as you get older. The condition may also be caused by surgery to remove both ovaries (bilateral oophorectomy). What increases the risk? This condition is more likely to start at an earlier age if you have certain medical conditions or treatments, including:  A tumor of the pituitary gland in the brain.  A disease that affects the ovaries and hormone production.  Radiation treatment for cancer.  Certain cancer treatments, such as chemotherapy or hormone (anti-estrogen) therapy.  Heavy smoking and excessive alcohol use.  Family history of early menopause. This condition is also more likely to develop earlier in women who are very thin. What are the signs or symptoms? Symptoms of this condition include:  Hot flashes.  Irregular menstrual periods.  Night sweats.  Changes in feelings about sex. This could be a decrease in sex drive or an increased comfort around your sexuality.  Vaginal dryness and thinning of the vaginal walls. This may cause painful intercourse.  Dryness of the skin and development of wrinkles.  Headaches.  Problems sleeping (insomnia).  Mood swings or irritability.  Memory problems.  Weight gain.  Hair growth on the face and chest.  Bladder infections or problems with urinating. How  is this diagnosed? This condition is diagnosed based on your medical history, a physical exam, your age, your menstrual history, and your symptoms. Hormone tests may also be done. How is this treated? In some cases, no treatment is needed. You and your health care provider should make a decision together about whether treatment is necessary. Treatment will be based on your individual condition and preferences. Treatment for this condition focuses on managing symptoms. Treatment may include:  Menopausal hormone therapy (MHT).  Medicines to treat specific symptoms or complications.  Acupuncture.  Vitamin or herbal supplements. Before starting treatment, make sure to let your health care provider know if you have a personal or family history of:  Heart disease.  Breast cancer.  Blood clots.  Diabetes.  Osteoporosis. Follow these instructions at home: Lifestyle  Do not use any products that contain nicotine or tobacco, such as cigarettes and e-cigarettes. If you need help quitting, ask your health care provider.  Get at least 30 minutes of physical activity on 5 or more days each week.  Avoid alcoholic and caffeinated beverages, as well as spicy foods. This may help prevent hot flashes.  Get 7-8 hours of sleep each night.  If you have hot flashes, try: ? Dressing in layers. ? Avoiding things that may trigger hot flashes, such as spicy food, warm places, or stress. ? Taking slow, deep breaths when a hot flash starts. ? Keeping a fan in your home and office.  Find ways to manage stress, such as deep breathing, meditation, or journaling.  Consider going to group therapy with other women who are having menopause symptoms. Ask your health care provider about recommended group therapy meetings. Eating and   drinking  Eat a healthy, balanced diet that contains whole grains, lean protein, low-fat dairy, and plenty of fruits and vegetables.  Your health care provider may recommend  adding more soy to your diet. Foods that contain soy include tofu, tempeh, and soy milk.  Eat plenty of foods that contain calcium and vitamin D for bone health. Items that are rich in calcium include low-fat milk, yogurt, beans, almonds, sardines, broccoli, and kale. Medicines  Take over-the-counter and prescription medicines only as told by your health care provider.  Talk with your health care provider before starting any herbal supplements. If prescribed, take vitamins and supplements as told by your health care provider. These may include: ? Calcium. Women age 61 and older should get 1,200 mg (milligrams) of calcium every day. ? Vitamin D. Women need 600-800 International Units of vitamin D each day. ? Vitamins B12 and B6. Aim for 50 micrograms of B12 and 1.5 mg of B6 each day. General instructions  Keep track of your menstrual periods, including: ? When they occur. ? How heavy they are and how long they last. ? How much time passes between periods.  Keep track of your symptoms, noting when they start, how often you have them, and how long they last.  Use vaginal lubricants or moisturizers to help with vaginal dryness and improve comfort during sex.  Keep all follow-up visits as told by your health care provider. This is important. This includes any group therapy or counseling. Contact a health care provider if:  You are still having menstrual periods after age 97.  You have pain during sex.  You have not had a period for 12 months and you develop vaginal bleeding. Get help right away if:  You have: ? Severe depression. ? Excessive vaginal bleeding. ? Pain when you urinate. ? A fast or irregular heart beat (palpitations). ? Severe headaches. ? Abdomen (abdominal) pain or severe indigestion.  You fell and you think you have a broken bone.  You develop leg or chest pain.  You develop vision problems.  You feel a lump in your breast. Summary  Menopause is the normal  time of life when menstrual periods stop completely. It is usually confirmed by 12 months without a menstrual period.  The transition to menopause (perimenopause) most often happens between the ages of 65 and 63.  Symptoms can be managed through medicines, lifestyle changes, and complementary therapies such as acupuncture.  Eat a balanced diet that is rich in nutrients to promote bone health and heart health and to manage symptoms during menopause. This information is not intended to replace advice given to you by your health care provider. Make sure you discuss any questions you have with your health care provider. Document Released: 01/20/2004 Document Revised: 12/02/2016 Document Reviewed: 12/02/2016 Elsevier Interactive Patient Education  2019 [employer]. Levonorgestrel intrauterine device (IUD) What is this medicine? LEVONORGESTREL IUD (LEE voe nor jes trel) is a contraceptive (birth control) device. The device is placed inside the uterus by a healthcare professional. It is used to prevent pregnancy. This device can also be used to treat heavy bleeding that occurs during your period. This medicine may be used for other purposes; ask your health care provider or pharmacist if you have questions. COMMON BRAND NAME(S): Minette Headland What should I tell my health care provider before I take this medicine? They need to know if you have any of these conditions: -abnormal Pap smear -cancer of the breast, uterus, or cervix -diabetes -  endometritis -genital or pelvic infection now or in the past -have more than one sexual partner or your partner has more than one partner -heart disease -history of an ectopic or tubal pregnancy -immune system problems -IUD in place -liver disease or tumor -problems with blood clots or take blood-thinners -seizures -use intravenous drugs -uterus of unusual shape -vaginal bleeding that has not been explained -an unusual or allergic  reaction to levonorgestrel, other hormones, silicone, or polyethylene, medicines, foods, dyes, or preservatives -pregnant or trying to get pregnant -breast-feeding How should I use this medicine? This device is placed inside the uterus by a health care professional. Talk to your pediatrician regarding the use of this medicine in children. Special care may be needed. Overdosage: If you think you have taken too much of this medicine contact a poison control center or emergency room at once. NOTE: This medicine is only for you. Do not share this medicine with others. What if I miss a dose? This does not apply. Depending on the brand of device you have inserted, the device will need to be replaced every 3 to 5 years if you wish to continue using this type of birth control. What may interact with this medicine? Do not take this medicine with any of the following medications: -amprenavir -bosentan -fosamprenavir This medicine may also interact with the following medications: -aprepitant -armodafinil -barbiturate medicines for inducing sleep or treating seizures -bexarotene -boceprevir -griseofulvin -medicines to treat seizures like carbamazepine, ethotoin, felbamate, oxcarbazepine, phenytoin, topiramate -modafinil -pioglitazone -rifabutin -rifampin -rifapentine -some medicines to treat HIV infection like atazanavir, efavirenz, indinavir, lopinavir, nelfinavir, tipranavir, ritonavir -St. John's wort -warfarin This list may not describe all possible interactions. Give your health care provider a list of all the medicines, herbs, non-prescription drugs, or dietary supplements you use. Also tell them if you smoke, drink alcohol, or use illegal drugs. Some items may interact with your medicine. What should I watch for while using this medicine? Visit your doctor or health care professional for regular check ups. See your doctor if you or your partner has sexual contact with others, becomes HIV  positive, or gets a sexual transmitted disease. This product does not protect you against HIV infection (AIDS) or other sexually transmitted diseases. You can check the placement of the IUD yourself by reaching up to the top of your vagina with clean fingers to feel the threads. Do not pull on the threads. It is a good habit to check placement after each menstrual period. Call your doctor right away if you feel more of the IUD than just the threads or if you cannot feel the threads at all. The IUD may come out by itself. You may become pregnant if the device comes out. If you notice that the IUD has come out use a backup birth control method like condoms and call your health care provider. Using tampons will not change the position of the IUD and are okay to use during your period. This IUD can be safely scanned with magnetic resonance imaging (MRI) only under specific conditions. Before you have an MRI, tell your healthcare provider that you have an IUD in place, and which type of IUD you have in place. What side effects may I notice from receiving this medicine? Side effects that you should report to your doctor or health care professional as soon as possible: -allergic reactions like skin rash, itching or hives, swelling of the face, lips, or tongue -fever, flu-like symptoms -genital sores -high blood pressure -no menstrual  period for 6 weeks during use -pain, swelling, warmth in the leg -pelvic pain or tenderness -severe or sudden headache -signs of pregnancy -stomach cramping -sudden shortness of breath -trouble with balance, talking, or walking -unusual vaginal bleeding, discharge -yellowing of the eyes or skin Side effects that usually do not require medical attention (report to your doctor or health care professional if they continue or are bothersome): -acne -breast pain -change in sex drive or performance -changes in weight -cramping, dizziness, or faintness while the device is  being inserted -headache -irregular menstrual bleeding within first 3 to 6 months of use -nausea This list may not describe all possible side effects. Call your doctor for medical advice about side effects. You may report side effects to FDA at 1-800-FDA-1088. Where should I keep my medicine? This does not apply. NOTE: This sheet is a summary. It may not cover all possible information. If you have questions about this medicine, talk to your doctor, pharmacist, or health care provider.  2019 Elsevier/Gold Standard (2016-08-11 14:14:56)

## 2018-12-27 ENCOUNTER — Other Ambulatory Visit: Payer: Self-pay | Admitting: Obstetrics & Gynecology

## 2018-12-27 DIAGNOSIS — N951 Menopausal and female climacteric states: Secondary | ICD-10-CM

## 2018-12-27 LAB — CYTOLOGY - PAP
HPV 16/18/45 genotyping: POSITIVE — AB
HPV: DETECTED — AB

## 2018-12-27 MED ORDER — ESCITALOPRAM OXALATE 10 MG PO TABS: 10.0000 mg | ORAL_TABLET | Freq: Every day | ORAL | 3 refills | Status: DC

## 2018-12-27 NOTE — Progress Notes (Signed)
Patient called answering service, wanted to pick up prescribed Lexapro as per her visit with Dr. Roselie Awkward, but it was unavailable.  I re-ordered the prescription for the patient and verified the correct pharmacy.   Verita Schneiders, MD, [place] for [facility], [place]

## 2018-12-30 ENCOUNTER — Encounter: Payer: Self-pay | Admitting: *Deleted

## 2018-12-30 NOTE — Progress Notes (Signed)
Asked by registrar to explain to patient why colposcopy appointment needed. I explained to patient she had abnormal pap and what colposcopy is and why she needs the appointment. She voices understanding.

## 2018-12-31 ENCOUNTER — Telehealth: Payer: Self-pay

## 2018-12-31 NOTE — Telephone Encounter (Signed)
Called pt to give test results on Pap, no answer, left message for call back.

## 2018-12-31 NOTE — Telephone Encounter (Signed)
Spoke with patient concerning lab results and the need for colp. Appt has been scheduled in March

## 2018-12-31 NOTE — Telephone Encounter (Signed)
Confirm.

## 2018-12-31 NOTE — Telephone Encounter (Signed)
-----   Message from Woodroe Mode, MD sent at 12/30/2018  9:22 AM EST ----- LSIL, needs to schedule colposcopy

## 2019-01-08 DIAGNOSIS — E538 Deficiency of other specified B group vitamins: Secondary | ICD-10-CM | POA: Diagnosis not present

## 2019-01-08 DIAGNOSIS — Z0000 Encounter for general adult medical examination without abnormal findings: Secondary | ICD-10-CM | POA: Diagnosis not present

## 2019-01-23 ENCOUNTER — Encounter: Payer: Self-pay | Admitting: Obstetrics & Gynecology

## 2019-01-23 ENCOUNTER — Ambulatory Visit (INDEPENDENT_AMBULATORY_CARE_PROVIDER_SITE_OTHER): Payer: 59 | Admitting: Obstetrics & Gynecology

## 2019-01-23 ENCOUNTER — Other Ambulatory Visit: Payer: Self-pay

## 2019-01-23 ENCOUNTER — Other Ambulatory Visit (HOSPITAL_COMMUNITY)
Admission: RE | Admit: 2019-01-23 | Discharge: 2019-01-23 | Disposition: A | Discharge status: Home / Self Care | Payer: 59 | Source: Ambulatory Visit | Attending: Obstetrics & Gynecology | Admitting: Obstetrics & Gynecology

## 2019-01-23 VITALS — BP 166/90 | HR 68 | Ht 67.0 in | Wt 248.2 lb

## 2019-01-23 DIAGNOSIS — Z3202 Encounter for pregnancy test, result negative: Secondary | ICD-10-CM | POA: Diagnosis not present

## 2019-01-23 DIAGNOSIS — R87810 Cervical high risk human papillomavirus (HPV) DNA test positive: Secondary | ICD-10-CM | POA: Insufficient documentation

## 2019-01-23 LAB — POCT PREGNANCY, URINE: PREG TEST UR: NEGATIVE

## 2019-01-23 NOTE — Progress Notes (Signed)
Pt states still having pain from her boil located in groin area on left side, also has been spotting for the last couple of days.

## 2019-01-23 NOTE — Addendum Note (Signed)
Addended by: Woodroe Mode on: 01/23/2019 02:46 PM   Modules accepted: Orders

## 2019-01-23 NOTE — Patient Instructions (Signed)
Colposcopy, Care After  This sheet gives you information about how to care for yourself after your procedure. Your doctor may also give you more specific instructions. If you have problems or questions, contact your doctor.  What can I expect after the procedure?  If you did not have a tissue sample removed (did not have a biopsy), you may only have some spotting for a few days. You can go back to your normal activities.  If you had a tissue sample removed, it is common to have:   Soreness and pain. This may last for a few days.   Light-headedness.   Mild bleeding from your vagina or dark-colored, grainy discharge from your vagina. This may last for a few days. You may need to wear a sanitary pad.   Spotting for at least 48 hours after the procedure.  Follow these instructions at home:     Take over-the-counter and prescription medicines only as told by your doctor. Ask your doctor what medicines you can start taking again. This is very important if you take blood-thinning medicine.   Do not drive or use heavy machinery while taking prescription pain medicine.   For 3 days, or as long as your doctor tells you, avoid:  ? Douching.  ? Using tampons.  ? Having sex.   If you use birth control (contraception), keep using it.   Limit activity for the first day after the procedure. Ask your doctor what activities are safe for you.   It is up to you to get the results of your procedure. Ask your doctor when your results will be ready.   Keep all follow-up visits as told by your doctor. This is important.  Contact a doctor if:   You get a skin rash.  Get help right away if:   You are bleeding a lot from your vagina. It is a lot of bleeding if you are using more than one pad an hour for 2 hours in a row.   You have clumps of blood (blood clots) coming from your vagina.   You have a fever.   You have chills   You have pain in your lower belly (pelvic area).   You have signs of infection, such as vaginal  discharge that is:  ? Different than usual.  ? Yellow.  ? Bad-smelling.   You have very pain or cramps in your lower belly that do not get better with medicine.   You feel light-headed.   You feel dizzy.   You pass out (faint).  Summary   If you did not have a tissue sample removed (did not have a biopsy), you may only have some spotting for a few days. You can go back to your normal activities.   If you had a tissue sample removed, it is common to have mild pain and spotting for 48 hours.   For 3 days, or as long as your doctor tells you, avoid douching, using tampons and having sex.   Get help right away if you have bleeding, very bad pain, or signs of infection.  This information is not intended to replace advice given to you by your health care provider. Make sure you discuss any questions you have with your health care provider.  Document Released: 04/17/2008 Document Revised: 07/19/2016 Document Reviewed: 07/19/2016  Elsevier Interactive Patient Education  2019 Elsevier Inc.

## 2019-01-23 NOTE — Progress Notes (Signed)
Patient ID: [name], female   DOB: 1966-11-08, 53 y.o.   MRN: [id number]  Chief Complaint  Patient presents with  . Procedure    HPI [name] is a 53 y.o. female.  [id number] She returns for colposcopy HPI  Indications: Pap smear on February 2020 showed: HPV pos for 16/18, negative cytology. Previous colposcopy: none. Prior cervical treatment: no treatment.  Past Medical History:  Diagnosis Date  . Arthritis   . Carpal tunnel syndrome   . Chest pain 03/07/2017  . Complication of anesthesia    " it makes me hallucinate "  . Deep vein thrombosis (DVT) ([place])   . Hypertension   . Obesity   . Pulmonary embolism ([place])   . Vitamin D deficiency     Past Surgical History:  Procedure Laterality Date  . CARPAL TUNNEL RELEASE Left   . CESAREAN SECTION     2 previous c-sections, [place]  . GASTRIC BYPASS  06/2014  . GASTRIC BYPASS  2015  . KNEE SURGERY Left   . LEFT HEART CATH  2012  . TUBAL LIGATION      Family History  Problem Relation Age of Onset  . Hypertension Mother   . Breast cancer Mother        unsure of age  . Heart disease Mother   . COPD Mother   . Hypertension Father   . Heart disease Father   . COPD Father   . Diabetes Paternal Aunt   . Diabetes Paternal Uncle   . Breast cancer Maternal Aunt        unsure of age  . Colon cancer Neg Hx   . Esophageal cancer Neg Hx   . Rectal cancer Neg Hx   . Stomach cancer Neg Hx     Social History Social History   Tobacco Use  . Smoking status: Never Smoker  . Smokeless tobacco: Never Used  Substance Use Topics  . Alcohol use: No  . Drug use: No    Allergies  Allergen Reactions  . Darvocet [Propoxyphene N-Acetaminophen] Other (See Comments)    Some kind of reaction during surgery  . Other Other (See Comments)  . Oxycodone-Acetaminophen Itching    Does not remember  . Percocet [Oxycodone-Acetaminophen] Other (See Comments)    Does not remember  . Vicodin [Hydrocodone-Acetaminophen] Other  (See Comments)    Heart to flutter  . Hydrocodone-Acetaminophen Palpitations    Heart to flutter    Current Outpatient Medications  Medication Sig Dispense Refill  . Biotin 10000 MCG TABS Take 1 capsule by mouth daily.    . Calcium Citrate 200 MG TABS Take 200 mg by mouth daily.     . Cholecalciferol (VITAMIN D PO) Take 1 tablet by mouth daily.    . Cyanocobalamin (RA VITAMIN B-12 TR) 1000 MCG TBCR Take 1,000 mcg by mouth daily.     Eduard Roux (AIMOVIG 140 DOSE) 70 MG/ML SOAJ Inject 140 mg into the skin every 30 (thirty) days. 2 pen 11  . escitalopram (LEXAPRO) 10 MG tablet Take 1 tablet (10 mg total) by mouth daily. 30 tablet 3  . metoprolol succinate (TOPROL-XL) 25 MG 24 hr tablet Take 1 tablet (25 mg total) by mouth daily. 30 tablet 0  . Multiple Vitamin (MULTIVITAMIN) capsule Take 1 capsule by mouth 3 (three) times daily.     . nitroGLYCERIN (NITROSTAT) 0.4 MG SL tablet Place 1 tablet (0.4 mg total) under the tongue every 5 (five) minutes as needed for  chest pain. 25 tablet 0  . pantoprazole (PROTONIX) 40 MG tablet Take 1 tablet (40 mg total) by mouth 2 (two) times daily. 30 tablet 0  . aspirin EC 81 MG EC tablet Take 1 tablet (81 mg total) by mouth daily. 30 tablet 0  . dicyclomine (BENTYL) 20 MG tablet Take 20 mg by mouth 4 (four) times daily.    . ferrous sulfate 325 (65 FE) MG tablet Take 325 mg by mouth daily with breakfast.    . hydrochlorothiazide (HYDRODIURIL) 25 MG tablet Take 25 mg by mouth daily.    . phentermine 30 MG capsule Take 30 mg by mouth daily.     [name] topiramate (TOPAMAX) 100 MG tablet Take 1 tablet (100 mg total) by mouth at bedtime. 30 tablet 11   No current facility-administered medications for this visit.    Facility-Administered Medications Ordered in Other Visits  Medication Dose Route Frequency Provider Last Rate Last Dose  . gadopentetate dimeglumine (MAGNEVIST) injection 20 mL  20 mL Intravenous Once PRN Melvenia Beam, MD        Review of  Systems Review of Systems  Blood pressure (!) 166/90, pulse 68, height 5\' 7"  (1.702 m), weight 248 lb 3.2 oz (112.6 kg).  Physical Exam Physical Exam  Data Reviewed Pap result  Assessment    Procedure Details  The risks and benefits of the procedure and Written informed consent obtained.  Speculum placed in vagina and excellent visualization of cervix achieved, cervix swabbed x 3 with acetic acid solution. No lesion seen, SCJ seen Specimens: ECC, Bx 12 and 6  Complications: none.     Plan    Specimens labelled and sent to Pathology. Call to discuss Pathology results in 2 weeks.      [name] 01/23/2019, 2:02 PM

## 2019-01-28 ENCOUNTER — Telehealth: Payer: Self-pay | Admitting: General Practice

## 2019-01-28 NOTE — Telephone Encounter (Signed)
Called patient & informed her of results and follow up recommendations. Patient verbalized understanding and states she had a boil checked out last week at her appt by Dr Roselie Awkward. Patient states she was instructed to let it come to a head and drain but it hasn't. Advised she try warm moist compresses and that should help. Patient verbalized understanding & had no questions.

## 2019-01-28 NOTE — Telephone Encounter (Signed)
-----   Message from Woodroe Mode, MD sent at 01/24/2019  4:58 PM EDT ----- Repeat pap one year for LSIL

## 2019-02-05 DIAGNOSIS — J00 Acute nasopharyngitis [common cold]: Secondary | ICD-10-CM | POA: Diagnosis not present

## 2019-02-07 ENCOUNTER — Encounter: Payer: Self-pay | Admitting: *Deleted

## 2019-04-14 ENCOUNTER — Telehealth: Payer: Self-pay

## 2019-04-14 DIAGNOSIS — Z20822 Contact with and (suspected) exposure to covid-19: Secondary | ICD-10-CM

## 2019-04-14 NOTE — Telephone Encounter (Signed)
[name], Nurse Practitioner, with [facility]; requesting to check pt. For COVID 19; phone # [phone];  Fax # [phone].   Pt. is symptomatic: chills, body aches, fatigue, sore throat, headache, and diarrhea.  Will contact pt. to schedule for testing.   Called pt. and scheduled for COVID 19 testing at the [facility], 04/15/19 @ 8:30 AM. (pt. requested to be scheduled tomorrow, due to diarrhea)  Advised to wear mask and drive up to the test site, and remain in car.  Verb. Understanding.

## 2019-04-15 ENCOUNTER — Other Ambulatory Visit: Payer: 59

## 2019-04-15 DIAGNOSIS — Z20822 Contact with and (suspected) exposure to covid-19: Secondary | ICD-10-CM

## 2019-04-16 LAB — NOVEL CORONAVIRUS, NAA: SARS-CoV-2, NAA: NOT DETECTED

## 2019-05-20 ENCOUNTER — Other Ambulatory Visit: Payer: Self-pay | Admitting: Nurse Practitioner

## 2019-05-20 ENCOUNTER — Ambulatory Visit
Admission: RE | Admit: 2019-05-20 | Discharge: 2019-05-20 | Disposition: A | Discharge status: Home / Self Care | Payer: No Typology Code available for payment source | Source: Ambulatory Visit | Attending: Nurse Practitioner | Admitting: Nurse Practitioner

## 2019-05-20 DIAGNOSIS — M25561 Pain in right knee: Secondary | ICD-10-CM

## 2019-05-20 DIAGNOSIS — W19XXXA Unspecified fall, initial encounter: Secondary | ICD-10-CM

## 2019-10-27 ENCOUNTER — Other Ambulatory Visit: Payer: Self-pay | Admitting: Lactation Services

## 2019-10-27 DIAGNOSIS — B379 Candidiasis, unspecified: Secondary | ICD-10-CM

## 2019-10-27 MED ORDER — FLUCONAZOLE 150 MG PO TABS: 150.0000 mg | ORAL_TABLET | Freq: Once | ORAL | 0 refills | Status: AC

## 2019-10-29 MED ORDER — FLUCONAZOLE 150 MG PO TABS: 150.0000 mg | ORAL_TABLET | Freq: Once | ORAL | 0 refills | Status: AC

## 2020-01-16 ENCOUNTER — Other Ambulatory Visit: Payer: Self-pay | Admitting: Physician Assistant

## 2020-01-16 DIAGNOSIS — Z1231 Encounter for screening mammogram for malignant neoplasm of breast: Secondary | ICD-10-CM

## 2020-01-18 ENCOUNTER — Ambulatory Visit: Payer: Self-pay | Attending: Internal Medicine

## 2020-01-18 DIAGNOSIS — Z23 Encounter for immunization: Secondary | ICD-10-CM | POA: Insufficient documentation

## 2020-01-18 NOTE — Progress Notes (Signed)
   Covid-19 Vaccination Clinic  Name:  [name]    MRN: [id number] DOB: Aug 06, 1966  01/18/2020  [name] was observed post Covid-19 immunization for 15 minutes without incident. She was provided with Vaccine Information Sheet and instruction to access the V-Safe system.   [name] was instructed to call 911 with any severe reactions post vaccine: [name] Difficulty breathing  . Swelling of face and throat  . A fast heartbeat  . A bad rash all over body  . Dizziness and weakness   Immunizations Administered    Name Date Dose VIS Date Route   Pfizer COVID-19 Vaccine 01/18/2020  6:10 PM 0.3 mL 10/24/2019 Intramuscular   Manufacturer: [place]   Lot: [id number]   [place]: [id number]

## 2020-02-05 ENCOUNTER — Ambulatory Visit: Payer: Self-pay

## 2020-02-18 ENCOUNTER — Ambulatory Visit: Payer: Self-pay | Attending: Internal Medicine

## 2020-02-18 DIAGNOSIS — Z23 Encounter for immunization: Secondary | ICD-10-CM

## 2020-02-18 NOTE — Progress Notes (Signed)
   Covid-19 Vaccination Clinic  Name:  TREVOR KEHN    MRN: [id number] DOB: 10/07/1966  02/18/2020  Ms. Mcmorris was observed post Covid-19 immunization for 15 minutes without incident. She was provided with Vaccine Information Sheet and instruction to access the V-Safe system.   Ms. Pollmann was instructed to call 911 with any severe reactions post vaccine: [name] Difficulty breathing  . Swelling of face and throat  . A fast heartbeat  . A bad rash all over body  . Dizziness and weakness   Immunizations Administered    Name Date Dose VIS Date Route   Pfizer COVID-19 Vaccine 02/18/2020  2:13 PM 0.3 mL 10/24/2019 Intramuscular   Manufacturer: [place]   Lot: [id number]   [place]: [id number]

## 2020-05-27 IMAGING — MG DIGITAL SCREENING BILATERAL MAMMOGRAM WITH TOMO AND CAD
8 series · 8 of 24 positions shown · non-contrast
Comparison: Previous exam(s).

CLINICAL DATA: Screening.

EXAM:
DIGITAL SCREENING BILATERAL MAMMOGRAM WITH TOMO AND CAD

[L CC synth-2D]
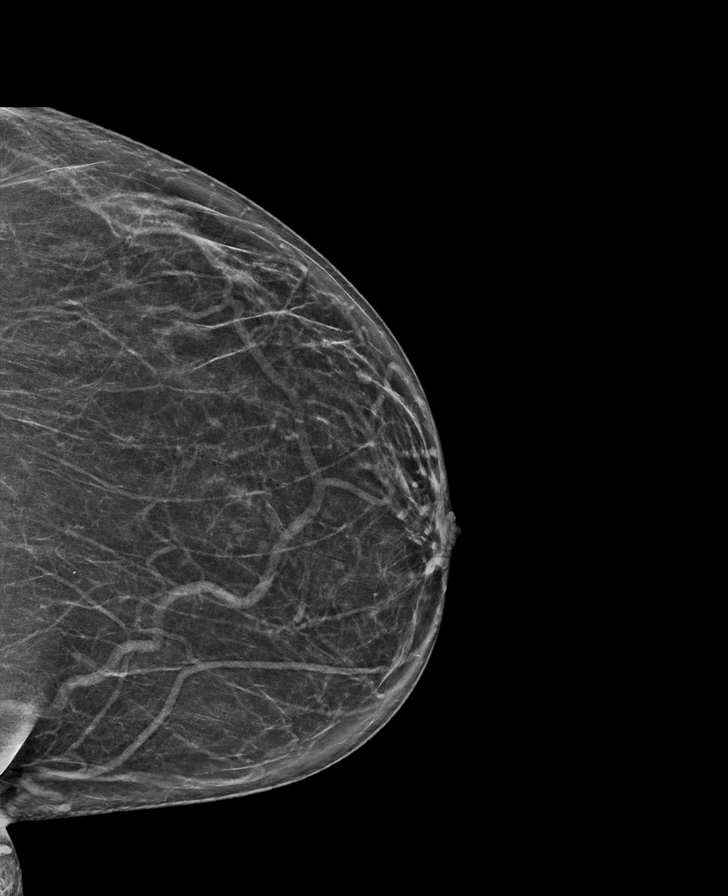

[R MLO synth-2D]
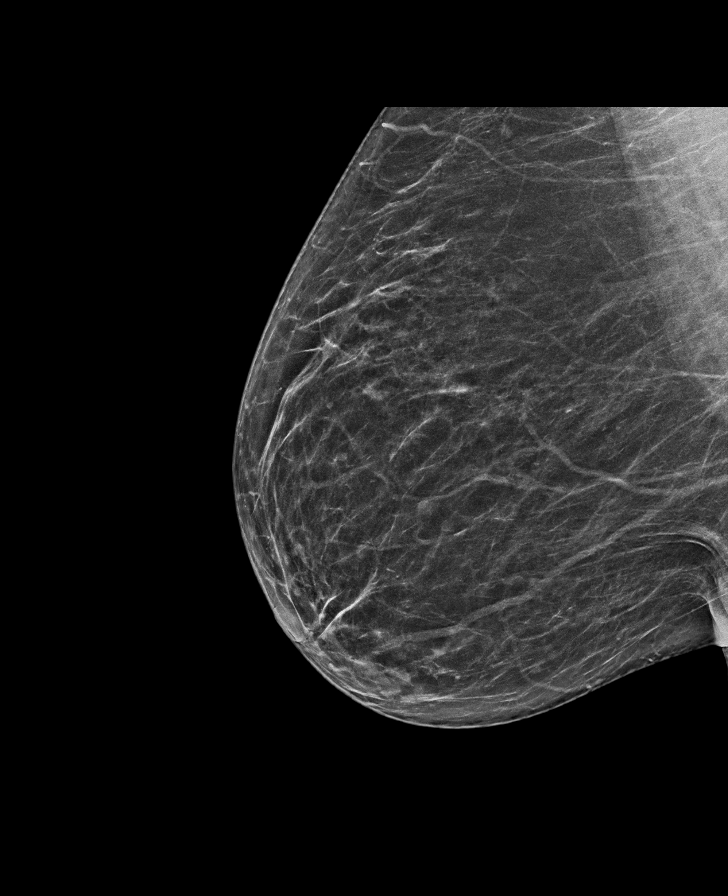

[L MLO synth-2D]
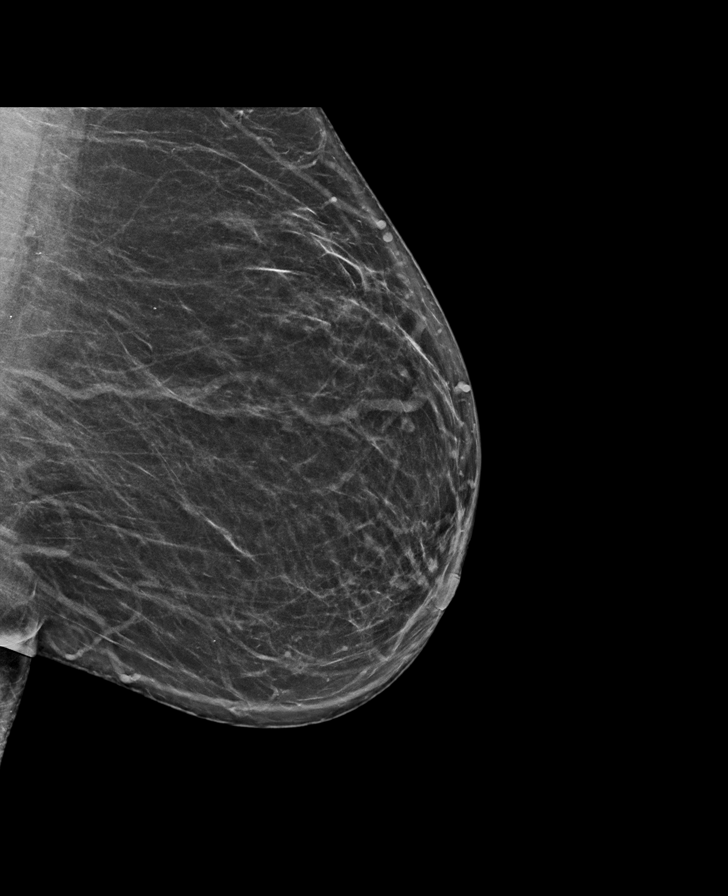

[R CC synth-2D]
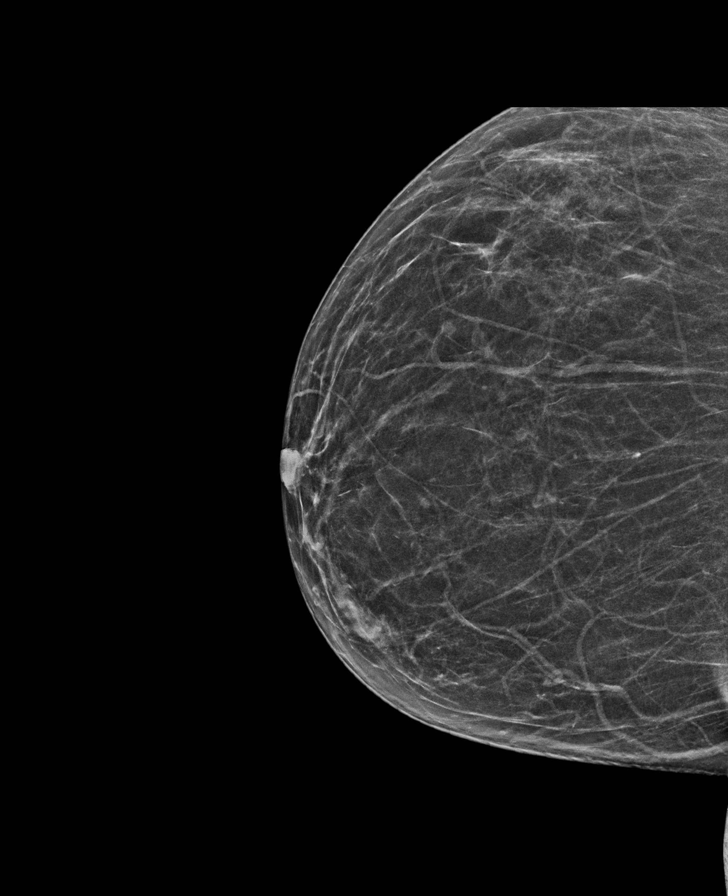

[R MLO tomo · tomo slice 35/70.0]
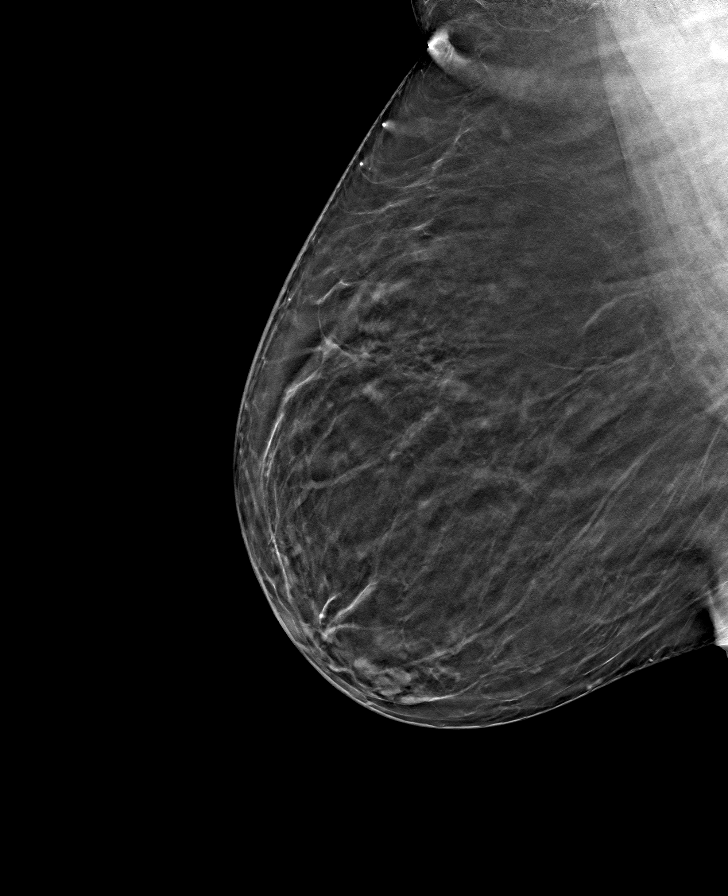

[L MLO tomo · tomo slice 36/71.0]
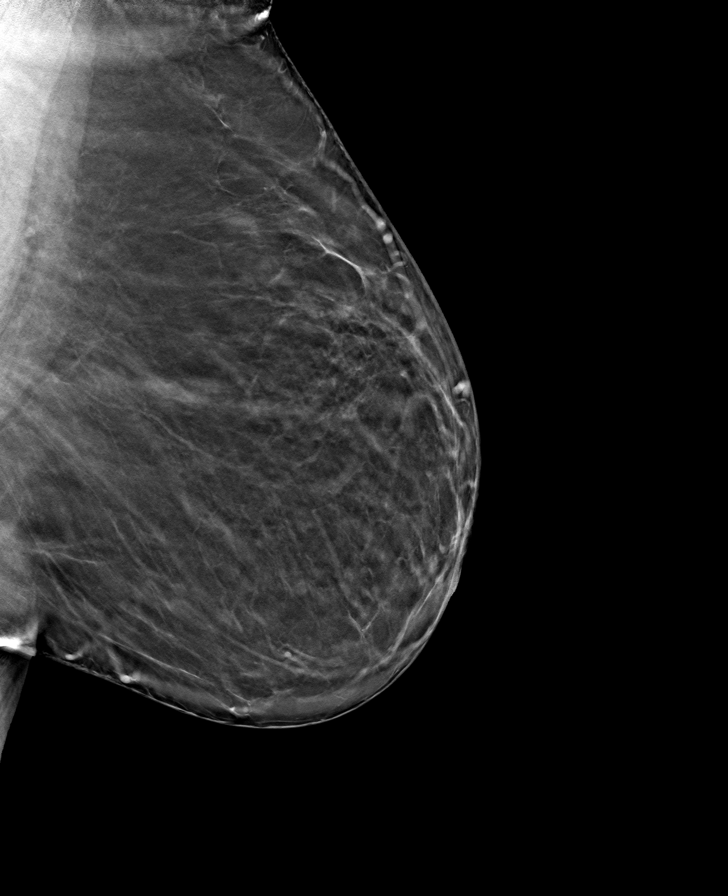

[L CC tomo · tomo slice 33/65.0]
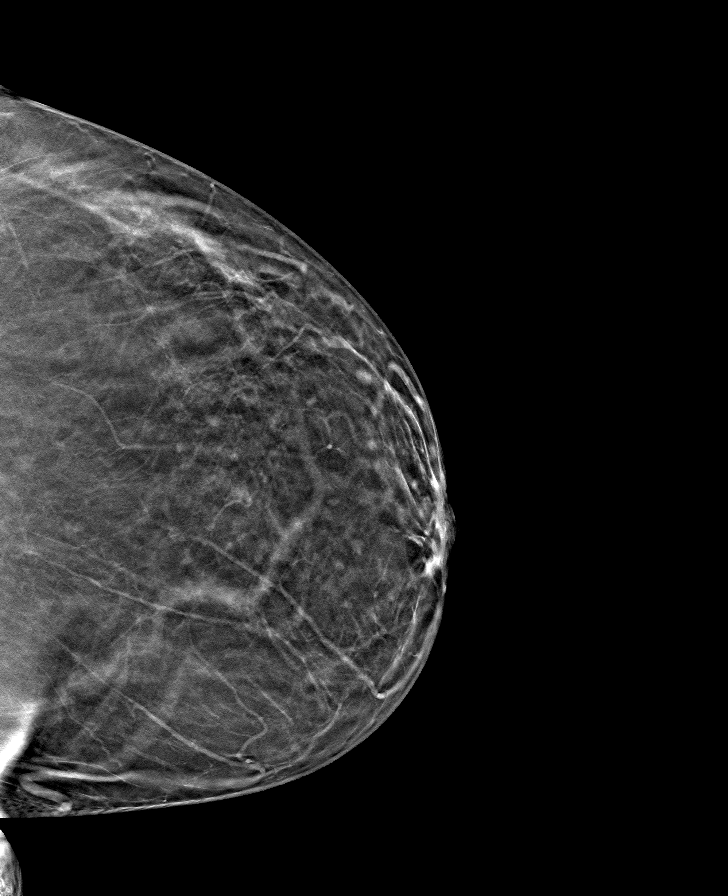

[R CC tomo · tomo slice 31/60.0]
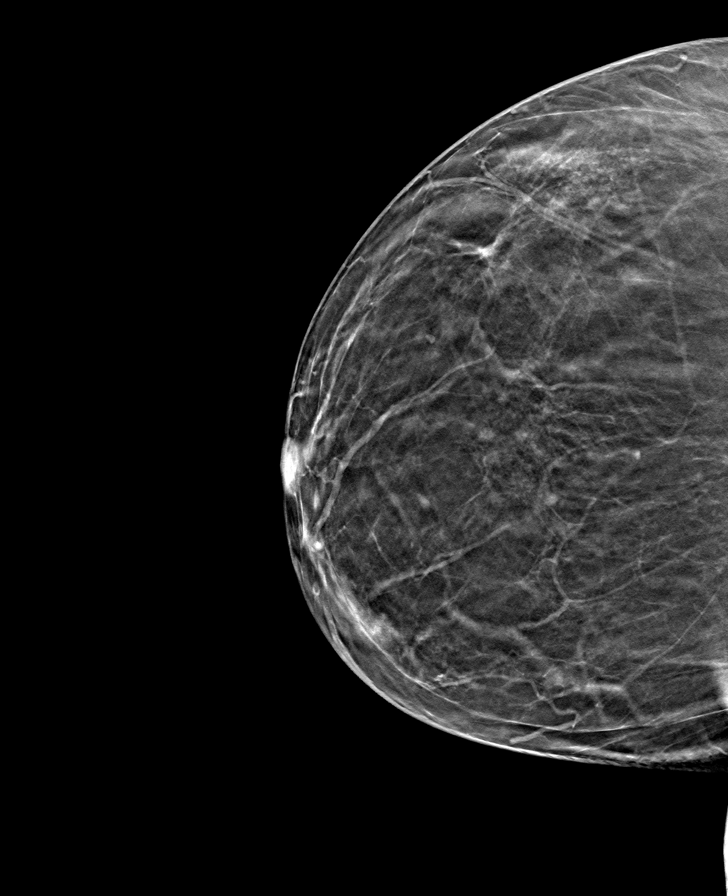

[8 of 24 positions shown; findings below may reference images not displayed]

ACR Breast Density Category b: There are scattered areas of
fibroglandular density.
FINDINGS: There are no findings suspicious for malignancy. Images were
processed with CAD.
IMPRESSION: No mammographic evidence of malignancy. A result letter of this
screening mammogram will be mailed directly to the patient.

RECOMMENDATION:
Screening mammogram in one year. (Code:[id number])

BI-RADS CATEGORY  1: Negative.

## 2020-07-08 ENCOUNTER — Telehealth: Payer: Self-pay | Admitting: *Deleted

## 2020-07-08 NOTE — Telephone Encounter (Signed)
Patient no showed PV today- Called patient and left message to return call by 5 pm today- If no call by 5 pm, PV and procedure will be canceled - no call at 5 pm -  PV and Procedure both canceled- No Show letter mailed to patient  

## 2020-07-20 ENCOUNTER — Encounter: Payer: Self-pay | Admitting: Internal Medicine

## 2021-03-05 ENCOUNTER — Emergency Department (HOSPITAL_BASED_OUTPATIENT_CLINIC_OR_DEPARTMENT_OTHER)
Admission: EM | Admit: 2021-03-05 | Discharge: 2021-03-05 | Disposition: A | Discharge status: Home / Self Care | Payer: 59 | Attending: Emergency Medicine | Admitting: Emergency Medicine

## 2021-03-05 ENCOUNTER — Other Ambulatory Visit: Payer: Self-pay

## 2021-03-05 ENCOUNTER — Emergency Department (HOSPITAL_BASED_OUTPATIENT_CLINIC_OR_DEPARTMENT_OTHER): Payer: 59

## 2021-03-05 ENCOUNTER — Encounter (HOSPITAL_BASED_OUTPATIENT_CLINIC_OR_DEPARTMENT_OTHER): Payer: Self-pay | Admitting: Emergency Medicine

## 2021-03-05 DIAGNOSIS — I10 Essential (primary) hypertension: Secondary | ICD-10-CM | POA: Insufficient documentation

## 2021-03-05 DIAGNOSIS — Y93G9 Activity, other involving cooking and grilling: Secondary | ICD-10-CM | POA: Insufficient documentation

## 2021-03-05 DIAGNOSIS — Z7982 Long term (current) use of aspirin: Secondary | ICD-10-CM | POA: Diagnosis not present

## 2021-03-05 DIAGNOSIS — S61112A Laceration without foreign body of left thumb with damage to nail, initial encounter: Secondary | ICD-10-CM | POA: Diagnosis not present

## 2021-03-05 DIAGNOSIS — S61211A Laceration without foreign body of left index finger without damage to nail, initial encounter: Secondary | ICD-10-CM | POA: Insufficient documentation

## 2021-03-05 DIAGNOSIS — Z79899 Other long term (current) drug therapy: Secondary | ICD-10-CM | POA: Diagnosis not present

## 2021-03-05 DIAGNOSIS — W274XXA Contact with kitchen utensil, initial encounter: Secondary | ICD-10-CM | POA: Insufficient documentation

## 2021-03-05 MED ORDER — LIDOCAINE HCL (PF) 1 % IJ SOLN
5.0000 mL | Freq: Once | INTRAMUSCULAR | Status: AC
  Administered 2021-03-05: 5 mL
  Filled 2021-03-05: qty 5

## 2021-03-05 MED ORDER — ONDANSETRON 4 MG PO TBDP
4.0000 mg | ORAL_TABLET | Freq: Once | ORAL | Status: AC
  Administered 2021-03-05: 4 mg via ORAL
  Filled 2021-03-05: qty 1

## 2021-03-05 MED ORDER — ACETAMINOPHEN 325 MG PO TABS
650.0000 mg | ORAL_TABLET | Freq: Once | ORAL | Status: AC
  Administered 2021-03-05: 650 mg via ORAL
  Filled 2021-03-05: qty 2

## 2021-03-05 NOTE — ED Triage Notes (Signed)
Reports slicing her left thumb and pointer finger using a mandolin to cut potatoes.  Lac to pointer finger noted to be small with bleeding controlled.  Thumb wrapped with gauze.

## 2021-03-05 NOTE — ED Provider Notes (Signed)
[facility] EMERGENCY DEPARTMENT Provider Note   CSN: [id number] Arrival date & time: 03/05/21  2005     History Chief Complaint  Patient presents with  . Extremity Laceration    [name] is a 55 y.o. female with a past medical history as noted below who presents to the ED due to left thumb laceration.  Patient states she was using a mandolin when she sliced her left thumb and index finger. Index finger laceration is superficial. Laceration to thumb is located at the tip of her finger that involves the very tip of the nail. No treatment prior to arrival. Last tetanus shot 2 years ago. Rates her pain a 9/10 worse with movement and palpation. No numbness/tingling. She is right hand dominant.   History obtained from patient and past medical records. No interpreter used during encounter.      Past Medical History:  Diagnosis Date  . Arthritis   . Carpal tunnel syndrome   . Chest pain 03/07/2017  . Complication of anesthesia    " it makes me hallucinate "  . Deep vein thrombosis (DVT) (HCC)   . Hypertension   . Obesity   . Pulmonary embolism (HCC)   . Vitamin D deficiency     Patient Active Problem List   Diagnosis Date Noted  . Chronic migraine without aura, with intractable migraine, so stated, with status migrainosus 01/21/2018  . Vestibular migraine 11/25/2017  . Chest pain 03/07/2017  . Hypertensive urgency 03/07/2017  . Microcytic anemia 03/07/2017  . Costochondritis 03/07/2017  . Dysmenorrhea 09/15/2011  . Morbid obesity (HCC) 06/09/2011  . DUB (dysfunctional uterine bleeding) 06/09/2011  . History of deep vein thrombosis (DVT) of lower extremity 06/09/2011  . High blood pressure 06/09/2011  . High cholesterol 06/09/2011    Past Surgical History:  Procedure Laterality Date  . CARPAL TUNNEL RELEASE Left   . CESAREAN SECTION     2 previous c-sections, 1988 & 1989  . GASTRIC BYPASS  06/2014  . GASTRIC BYPASS  2015  . KNEE SURGERY Left   . LEFT  HEART CATH  2012  . TUBAL LIGATION       OB History    Gravida  5   Para  2   Term  1   Preterm  1   AB  3   Living  2     SAB  3   IAB      Ectopic      Multiple      Live Births              Family History  Problem Relation Age of Onset  . Hypertension Mother   . Breast cancer Mother        unsure of age  . Heart disease Mother   . COPD Mother   . Hypertension Father   . Heart disease Father   . COPD Father   . Diabetes Paternal Aunt   . Diabetes Paternal Uncle   . Breast cancer Maternal Aunt        unsure of age  . Colon cancer Neg Hx   . Esophageal cancer Neg Hx   . Rectal cancer Neg Hx   . Stomach cancer Neg Hx     Social History   Tobacco Use  . Smoking status: Never Smoker  . Smokeless tobacco: Never Used  Vaping Use  . Vaping Use: Never used  Substance Use Topics  . Alcohol use: No  . Drug  use: No    Home Medications Prior to Admission medications   Medication Sig Start Date End Date Taking? Authorizing Provider  aspirin EC 81 MG EC tablet Take 1 tablet (81 mg total) by mouth daily. 03/09/17   Jani Gravel, MD  Biotin 10000 MCG TABS Take 1 capsule by mouth daily.    [provider]  Calcium Citrate 200 MG TABS Take 200 mg by mouth daily.     [provider]  Cholecalciferol (VITAMIN D PO) Take 1 tablet by mouth daily.    [provider]  Cyanocobalamin (RA VITAMIN B-12 TR) 1000 MCG TBCR Take 1,000 mcg by mouth daily.     [provider]  dicyclomine (BENTYL) 20 MG tablet Take 20 mg by mouth 4 (four) times daily.    [provider]  Erenumab-aooe (AIMOVIG 140 DOSE) 70 MG/ML SOAJ Inject 140 mg into the skin every 30 (thirty) days. 01/21/18   Melvenia Beam, MD  escitalopram (LEXAPRO) 10 MG tablet Take 1 tablet (10 mg total) by mouth daily. 12/27/18   Anyanwu, Sallyanne Havers, MD  ferrous sulfate 325 (65 FE) MG tablet Take 325 mg by mouth daily with breakfast.    [provider]   hydrochlorothiazide (HYDRODIURIL) 25 MG tablet Take 25 mg by mouth daily.    [provider]  metoprolol succinate (TOPROL-XL) 25 MG 24 hr tablet Take 1 tablet (25 mg total) by mouth daily. 03/09/17   Jani Gravel, MD  Multiple Vitamin (MULTIVITAMIN) capsule Take 1 capsule by mouth 3 (three) times daily.     [provider]  nitroGLYCERIN (NITROSTAT) 0.4 MG SL tablet Place 1 tablet (0.4 mg total) under the tongue every 5 (five) minutes as needed for chest pain. 03/09/17   Jani Gravel, MD  pantoprazole (PROTONIX) 40 MG tablet Take 1 tablet (40 mg total) by mouth 2 (two) times daily. 03/09/17   Jani Gravel, MD  phentermine 30 MG capsule Take 30 mg by mouth daily.  04/25/17   [provider]  topiramate (TOPAMAX) 100 MG tablet Take 1 tablet (100 mg total) by mouth at bedtime. 11/22/17   Melvenia Beam, MD    Allergies    Darvocet [propoxyphene n-acetaminophen], Other, Oxycodone-acetaminophen, Percocet [oxycodone-acetaminophen], Vicodin [hydrocodone-acetaminophen], and Hydrocodone-acetaminophen  Review of Systems   Review of Systems  Musculoskeletal: Positive for arthralgias.  Skin: Positive for wound.  Neurological: Negative for seizures.  All other systems reviewed and are negative.   Physical Exam Updated Vital Signs BP 121/77 (BP Location: Right Arm)   Pulse 72   Temp 98.3 F (36.8 C) (Oral)   Resp 17   Ht 5\' 7"  (1.702 m)   Wt 82.1 kg   SpO2 100%   BMI 28.35 kg/m   Physical Exam Vitals and nursing note reviewed.  Constitutional:      General: She is not in acute distress.    Appearance: She is not ill-appearing.  HENT:     Head: Normocephalic.  Eyes:     Pupils: Pupils are equal, round, and reactive to light.  Cardiovascular:     Rate and Rhythm: Normal rate and regular rhythm.     Pulses: Normal pulses.     Heart sounds: Normal heart sounds. No murmur heard. No friction rub. No gallop.   Pulmonary:     Effort: Pulmonary effort is normal.      Breath sounds: Normal breath sounds.  Abdominal:     General: Abdomen is flat. There is no distension.  Palpations: Abdomen is soft.     Tenderness: There is no abdominal tenderness. There is no guarding or rebound.  Musculoskeletal:        General: Normal range of motion.     Cervical back: Neck supple.     Comments: Full ROM of left thumb and all fingers. Radial pulse palpable. No snuffbox tenderness.   Skin:    General: Skin is warm and dry.     Comments: 2cm laceration to tip of left thumb that involves the tip of the nail. Superficial laceration to left index finger.   Neurological:     General: No focal deficit present.     Mental Status: She is alert.  Psychiatric:        Mood and Affect: Mood normal.        Behavior: Behavior normal.     ED Results / Procedures / Treatments   Labs (all labs ordered are listed, but only abnormal results are displayed) Labs Reviewed - No data to display  EKG None  Radiology DG Finger Thumb Left  Result Date: 03/05/2021 CLINICAL DATA:  Laceration to tip of thumb. EXAM: LEFT THUMB 2+V COMPARISON:  None. FINDINGS: There is no evidence of fracture or dislocation. Minimal osteoarthritis of the interphalangeal joint skin and soft tissue irregularity about the distal thumb. No radiopaque foreign body. IMPRESSION: 1. Soft tissue irregularity about the distal thumb consistent with laceration. No fracture or radiopaque foreign body. 2. Mild osteoarthritis of the interphalangeal joint. Electronically Signed   By: Keith Rake M.D.   On: 03/05/2021 22:05    Procedures .Marland KitchenLaceration Repair  Date/Time: 03/05/2021 10:48 PM Performed by: Suzy Bouchard, PA-C Authorized by: Suzy Bouchard, PA-C   Consent:    Consent obtained:  Verbal   Consent given by:  Patient   Risks discussed:  Infection, need for additional repair, pain, poor cosmetic result and poor wound healing   Alternatives discussed:  No treatment and delayed  treatment Universal protocol:    Procedure explained and questions answered to patient or proxy's satisfaction: yes     Relevant documents present and verified: yes     Test results available: yes     Imaging studies available: yes     Required blood products, implants, devices, and special equipment available: yes     Site/side marked: yes     Immediately prior to procedure, a time out was called: yes     Patient identity confirmed:  Verbally with patient Anesthesia:    Anesthesia method:  Local infiltration   Local anesthetic:  Lidocaine 1% w/o epi Laceration details:    Location:  Finger   Finger location:  L thumb   Length (cm):  2   Depth (mm):  2 Pre-procedure details:    Preparation:  Patient was prepped and draped in usual sterile fashion and imaging obtained to evaluate for foreign bodies Exploration:    Limited defect created (wound extended): no     Hemostasis achieved with:  Direct pressure   Imaging obtained: x-ray     Imaging outcome: foreign body not noted     Wound exploration: wound explored through full range of motion and entire depth of wound visualized     Wound extent: no foreign bodies/material noted and no underlying fracture noted   Treatment:    Area cleansed with:  Saline   Amount of cleaning:  Standard   Irrigation solution:  Sterile saline   Irrigation volume:  100   Irrigation method:  Pressure wash   Visualized foreign bodies/material removed: no   Skin repair:    Repair method:  Sutures   Suture size:  5-0 and 4-0   Suture material:  Prolene   Suture technique:  Simple interrupted   Number of sutures:  5 Approximation:    Approximation:  Close Repair type:    Repair type:  Intermediate Post-procedure details:    Dressing:  Non-adherent dressing   Procedure completion:  Tolerated well, no immediate complications     Medications Ordered in ED Medications  acetaminophen (TYLENOL) tablet 650 mg (650 mg Oral Given 03/05/21 2201)   lidocaine (PF) (XYLOCAINE) 1 % injection 5 mL (5 mLs Infiltration Given by Other 03/05/21 2202)  ondansetron (ZOFRAN-ODT) disintegrating tablet 4 mg (4 mg Oral Given 03/05/21 2242)    ED Course  I have reviewed the triage vital signs and the nursing notes.  Pertinent labs & imaging results that were available during my care of the patient were reviewed by me and considered in my medical decision making (see chart for details).    MDM Rules/Calculators/A&P                         55 year old female presents to the ED due to laceration to left thumb and index finger from a mandolin. Tetanus booster 2 years ago.  Upon arrival, stable vitals.  Patient in no acute distress and non-ill-appearing.  Physical exam significant for 2 cm laceration to left thumb that involves the tip of the nail.  Hemostasis achieved.  Full range of motion of left thumb.  Superficial laceration to left index finger.  X-ray personally reviewed which is negative for any bony fractures.  Wound thoroughly cleaned and laceration repair as noted above  No sutures warranted to left index finger due to superficial nature of laceration.  Instructed patient take over-the-counter ibuprofen or Tylenol as needed for pain. Patient given hand surgery number at discharge and instructed to call for further evaluation. Strict ED precautions discussed with patient. Patient states understanding and agrees to plan. Patient discharged home in no acute distress and stable vitals. Final Clinical Impression(s) / ED Diagnoses Final diagnoses:  Laceration of left thumb without foreign body with damage to nail, initial encounter    Rx / DC Orders ED Discharge Orders    None       Karie Kirks 03/05/21 2306    Drenda Freeze, MD 03/05/21 [phone]

## 2021-03-05 NOTE — Discharge Instructions (Addendum)
You will need your sutures removed in 10-14 days. I have included the number of the hand surgeon. Call on Monday to schedule an appointment for further evaluation. You may take over-the-counter ibuprofen or Tylenol as needed for pain.  Return to the ER for new or worsening symptoms.

## 2021-03-05 NOTE — ED Notes (Signed)
Assumed care of this patient. Vitals taken. A&Ox4. Laceration noted to right hand thumb finger and pointer finger. Respirations regular/unlabroed. Connected to BP and pulse ox. Strecther low, wheels locked, call bell within reach.

## 2021-05-29 ENCOUNTER — Encounter (HOSPITAL_COMMUNITY): Payer: Self-pay | Admitting: Oncology

## 2021-05-29 ENCOUNTER — Emergency Department (HOSPITAL_COMMUNITY): Payer: 59

## 2021-05-29 ENCOUNTER — Other Ambulatory Visit: Payer: Self-pay

## 2021-05-29 ENCOUNTER — Inpatient Hospital Stay (HOSPITAL_COMMUNITY)
Admission: EM | Admit: 2021-05-29 | Discharge: 2021-06-01 | DRG: 310 | Disposition: A | Discharge status: Home / Self Care | Payer: 59 | Attending: Internal Medicine | Admitting: Internal Medicine

## 2021-05-29 DIAGNOSIS — Z86718 Personal history of other venous thrombosis and embolism: Secondary | ICD-10-CM

## 2021-05-29 DIAGNOSIS — Z6831 Body mass index (BMI) 31.0-31.9, adult: Secondary | ICD-10-CM | POA: Diagnosis not present

## 2021-05-29 DIAGNOSIS — R519 Headache, unspecified: Secondary | ICD-10-CM | POA: Diagnosis not present

## 2021-05-29 DIAGNOSIS — Z7982 Long term (current) use of aspirin: Secondary | ICD-10-CM | POA: Diagnosis not present

## 2021-05-29 DIAGNOSIS — M1990 Unspecified osteoarthritis, unspecified site: Secondary | ICD-10-CM | POA: Diagnosis present

## 2021-05-29 DIAGNOSIS — E876 Hypokalemia: Secondary | ICD-10-CM | POA: Diagnosis present

## 2021-05-29 DIAGNOSIS — E559 Vitamin D deficiency, unspecified: Secondary | ICD-10-CM | POA: Diagnosis present

## 2021-05-29 DIAGNOSIS — F1020 Alcohol dependence, uncomplicated: Secondary | ICD-10-CM | POA: Diagnosis present

## 2021-05-29 DIAGNOSIS — Z8249 Family history of ischemic heart disease and other diseases of the circulatory system: Secondary | ICD-10-CM

## 2021-05-29 DIAGNOSIS — I10 Essential (primary) hypertension: Secondary | ICD-10-CM | POA: Diagnosis present

## 2021-05-29 DIAGNOSIS — Z885 Allergy status to narcotic agent status: Secondary | ICD-10-CM | POA: Diagnosis not present

## 2021-05-29 DIAGNOSIS — R000 Tachycardia, unspecified: Secondary | ICD-10-CM | POA: Diagnosis present

## 2021-05-29 DIAGNOSIS — E669 Obesity, unspecified: Secondary | ICD-10-CM | POA: Diagnosis present

## 2021-05-29 DIAGNOSIS — Z86711 Personal history of pulmonary embolism: Secondary | ICD-10-CM | POA: Diagnosis not present

## 2021-05-29 DIAGNOSIS — Z9884 Bariatric surgery status: Secondary | ICD-10-CM

## 2021-05-29 DIAGNOSIS — E7800 Pure hypercholesterolemia, unspecified: Secondary | ICD-10-CM | POA: Diagnosis present

## 2021-05-29 DIAGNOSIS — Z888 Allergy status to other drugs, medicaments and biological substances status: Secondary | ICD-10-CM

## 2021-05-29 DIAGNOSIS — I4891 Unspecified atrial fibrillation: Secondary | ICD-10-CM | POA: Diagnosis present

## 2021-05-29 DIAGNOSIS — Z20822 Contact with and (suspected) exposure to covid-19: Secondary | ICD-10-CM | POA: Diagnosis present

## 2021-05-29 DIAGNOSIS — I480 Paroxysmal atrial fibrillation: Secondary | ICD-10-CM | POA: Diagnosis present

## 2021-05-29 DIAGNOSIS — Z79899 Other long term (current) drug therapy: Secondary | ICD-10-CM

## 2021-05-29 DIAGNOSIS — I160 Hypertensive urgency: Secondary | ICD-10-CM | POA: Diagnosis present

## 2021-05-29 DIAGNOSIS — E162 Hypoglycemia, unspecified: Secondary | ICD-10-CM | POA: Diagnosis present

## 2021-05-29 DIAGNOSIS — M25512 Pain in left shoulder: Secondary | ICD-10-CM | POA: Diagnosis present

## 2021-05-29 DIAGNOSIS — Z7141 Alcohol abuse counseling and surveillance of alcoholic: Secondary | ICD-10-CM

## 2021-05-29 LAB — COMPREHENSIVE METABOLIC PANEL
ALT: 17 U/L (ref 0–44)
AST: 34 U/L (ref 15–41)
Albumin: 3.7 g/dL (ref 3.5–5.0)
Alkaline Phosphatase: 56 U/L (ref 38–126)
Anion gap: 9 (ref 5–15)
BUN: 13 mg/dL (ref 6–20)
CO2: 26 mmol/L (ref 22–32)
Calcium: 8.8 mg/dL — ABNORMAL LOW (ref 8.9–10.3)
Chloride: 106 mmol/L (ref 98–111)
Creatinine, Ser: 0.59 mg/dL (ref 0.44–1.00)
GFR, Estimated: >60 mL/min (ref >60)
Glucose, Bld: 115 mg/dL — ABNORMAL HIGH (ref 70–99)
Potassium: 2.9 mmol/L — ABNORMAL LOW (ref 3.5–5.1)
Sodium: 141 mmol/L (ref 135–145)
Total Bilirubin: 0.8 mg/dL (ref 0.3–1.2)
Total Protein: 7.2 g/dL (ref 6.5–8.1)

## 2021-05-29 LAB — CBC WITH DIFFERENTIAL/PLATELET
Abs Immature Granulocytes: 0.02 10*3/uL (ref 0.00–0.07)
Basophils Absolute: 0 10*3/uL (ref 0.0–0.1)
Basophils Relative: 0 %
Eosinophils Absolute: 0 10*3/uL (ref 0.0–0.5)
Eosinophils Relative: 0 %
HCT: 42.9 % (ref 36.0–46.0)
Hemoglobin: 13.1 g/dL (ref 12.0–15.0)
Immature Granulocytes: 0 %
Lymphocytes Relative: 9 %
Lymphs Abs: 0.8 10*3/uL (ref 0.7–4.0)
MCH: 28.9 pg (ref 26.0–34.0)
MCHC: 30.5 g/dL (ref 30.0–36.0)
MCV: 94.5 fL (ref 80.0–100.0)
Monocytes Absolute: 0.8 10*3/uL (ref 0.1–1.0)
Monocytes Relative: 10 %
Neutro Abs: 6.7 10*3/uL (ref 1.7–7.7)
Neutrophils Relative %: 81 %
Platelets: 107 10*3/uL — ABNORMAL LOW (ref 150–400)
RBC: 4.54 MIL/uL (ref 3.87–5.11)
RDW: 13.9 % (ref 11.5–15.5)
WBC: 8.4 10*3/uL (ref 4.0–10.5)
nRBC: 0 % (ref 0.0–0.2)

## 2021-05-29 LAB — URINALYSIS, ROUTINE W REFLEX MICROSCOPIC
Bacteria, UA: NONE SEEN
Bilirubin Urine: NEGATIVE
Glucose, UA: NEGATIVE mg/dL
Ketones, ur: NEGATIVE mg/dL
Leukocytes,Ua: NEGATIVE
Nitrite: NEGATIVE
Protein, ur: NEGATIVE mg/dL
Specific Gravity, Urine: 1.009 (ref 1.005–1.030)
pH: 6 (ref 5.0–8.0)

## 2021-05-29 LAB — RAPID URINE DRUG SCREEN, HOSP PERFORMED
Amphetamines: NOT DETECTED
Barbiturates: NOT DETECTED
Benzodiazepines: NOT DETECTED
Cocaine: NOT DETECTED
Opiates: NOT DETECTED
Tetrahydrocannabinol: NOT DETECTED

## 2021-05-29 LAB — RESP PANEL BY RT-PCR (FLU A&B, COVID) ARPGX2
Influenza A by PCR: NEGATIVE
Influenza B by PCR: NEGATIVE
SARS Coronavirus 2 by RT PCR: NEGATIVE

## 2021-05-29 LAB — ETHANOL: Alcohol, Ethyl (B): <10 mg/dL (ref <10)

## 2021-05-29 LAB — CBG MONITORING, ED
Glucose-Capillary: 101 mg/dL — ABNORMAL HIGH (ref 70–99)
Glucose-Capillary: 73 mg/dL (ref 70–99)

## 2021-05-29 LAB — TSH: TSH: 1.359 u[IU]/mL (ref 0.350–4.500)

## 2021-05-29 MED ORDER — HEPARIN BOLUS VIA INFUSION
4000.0000 [IU] | Freq: Once | INTRAVENOUS | Status: AC
  Administered 2021-05-29: 4000 [IU] via INTRAVENOUS
  Filled 2021-05-29 (×2): qty 4000

## 2021-05-29 MED ORDER — POTASSIUM CHLORIDE CRYS ER 20 MEQ PO TBCR
40.0000 meq | EXTENDED_RELEASE_TABLET | Freq: Once | ORAL | Status: AC
  Administered 2021-05-29: 40 meq via ORAL
  Filled 2021-05-29: qty 2

## 2021-05-29 MED ORDER — HEPARIN (PORCINE) 25000 UT/250ML-% IV SOLN
1200.0000 [IU]/h | INTRAVENOUS | Status: DC
  Administered 2021-05-29: 1200 [IU]/h via INTRAVENOUS
  Filled 2021-05-29: qty 250

## 2021-05-29 MED ORDER — METOPROLOL TARTRATE 5 MG/5ML IV SOLN
5.0000 mg | Freq: Once | INTRAVENOUS | Status: AC
  Administered 2021-05-29: 5 mg via INTRAVENOUS
  Filled 2021-05-29: qty 5

## 2021-05-29 MED ORDER — SODIUM CHLORIDE 0.9 % IV SOLN: Freq: Once | INTRAVENOUS | Status: DC

## 2021-05-29 MED ORDER — SODIUM CHLORIDE 0.9 % IV BOLUS: 1000.0000 mL | Freq: Once | INTRAVENOUS | Status: DC

## 2021-05-29 NOTE — Progress Notes (Signed)
ANTICOAGULATION CONSULT NOTE   Pharmacy Consult for heparin Indication: atrial fibrillation  Allergies  Allergen Reactions   Darvocet [Propoxyphene N-Acetaminophen] Other (See Comments)    Some kind of reaction during surgery   Other Other (See Comments)   Oxycodone-Acetaminophen Itching    Does not remember   Percocet [Oxycodone-Acetaminophen] Other (See Comments)    Does not remember   Vicodin [Hydrocodone-Acetaminophen] Other (See Comments)    Heart to flutter   Hydrocodone-Acetaminophen Palpitations    Heart to flutter    Patient Measurements: Height: 5\' 7"  (170.2 cm) Weight: 90.7 kg (200 lb) IBW/kg (Calculated) : 61.6 Heparin Dosing Weight: 81 kg  Vital Signs: Temp: 98.2 F (36.8 C) (07/17 1814) Temp Source: Oral (07/17 1814) BP: 121/102 (07/17 1930) Pulse Rate: 98 (07/17 1930)  Labs: Recent Labs    05/29/21 1845  HGB 13.1  HCT 42.9  PLT 107*  CREATININE 0.59    Estimated Creatinine Clearance: 89.7 mL/min (by C-G formula based on SCr of 0.59 mg/dL).   Assessment: Patient is a 55 y.o F presented to the ED on 05/29/21 with hypoglycemia.  She was subsequently found to be in Afib.  Pharmacy has been consulted to start heparin drip for stroke prevention.  Goal of Therapy:  Heparin level 0.3-0.7 units/ml Monitor platelets by anticoagulation protocol: Yes   Plan:  - heparin 4000 units IV x1, then drip at 1200 units/hr - check 8 hr heparin level - monitor for s/sx bleeding  Lynelle Doctor 05/29/2021,8:07 PM

## 2021-05-29 NOTE — H&P (Signed)
History and Physical   [name] VVO:[id number] DOB: 02-02-1966 DOA: 05/29/2021  Referring MD/NP/PA: Dr. Eulis Foster  PCP: Andria Frames, PA-C   Outpatient Specialists: None   Patient coming from: Home  Chief Complaint: Low blood sugar  HPI: [name] is a 55 y.o. female with medical history significant of hypertension, history of DVT, history of PE, vitamin D deficiency, history of gastric bypass surgery in August 2015, complex migraines who presented to the ER with low blood sugar and palpitations.  Patient has had previous diagnosis of palpitations and has been on metoprolol.  Not on any anticoagulation.  She was never told she has A. fib.  Patient's son called mother and she was not acting right.  Called EMS who arrived.  When they arrived her blood sugar was 40.  She received 25 g of D10 and blood sugar improved to about 134.  Brought to the ER for evaluation.  In the ER she was noted to be in atrial fibrillation with occasional rapid response.  She has had decreased appetite apparently.  This has been progressive over a long period of time.  She says she drank some alcohol today about 2 shots.  Denied heavy alcohol intake but has been drinking on a regular basis.  She did not eat any food today.  Not on insulin and not a diabetic.  No significant IV drug use.  On a regular basis she drinks 4-5 shots daily.  She also reported headaches but has history of migraine headaches.  This has improved.  Patient being admitted with new onset A. fib as well as hypoglycemia.  She also appears to have possibly alcoholism..  ED Course: Temperature 98.2 blood pressure 121/102, pulse 174 respirate of 28 oxygen sat 100% room air.  White count 8.4 hemoglobin of 13.1 and platelets 107.  Potassium was 2.9 calcium 8.8 otherwise the rest of the chemistry is within normal.  Glucose is up to 114 now.  COVID-19 and influenza screen is negative.  TSH 1.359.  Chest x-ray is negative.  Patient is being admitted  with new onset A. fib with RVR as well as hypoglycemia.  Review of Systems: As per HPI otherwise 10 point review of systems negative.    Past Medical History:  Diagnosis Date   Arthritis    Carpal tunnel syndrome    Chest pain 26/94/8546   Complication of anesthesia    " it makes me hallucinate "   Deep vein thrombosis (DVT) ([place])    Hypertension    Obesity    Pulmonary embolism ([place])    Vitamin D deficiency     Past Surgical History:  Procedure Laterality Date   CARPAL TUNNEL RELEASE Left    CESAREAN SECTION     2 previous c-sections, 1988 & 1989   GASTRIC BYPASS  06/2014   GASTRIC BYPASS  2015   KNEE SURGERY Left    LEFT HEART CATH  2012   TUBAL LIGATION       reports that she has never smoked. She has never used smokeless tobacco. She reports that she does not drink alcohol and does not use drugs.  Allergies  Allergen Reactions   Darvocet [Propoxyphene N-Acetaminophen] Other (See Comments)    Some kind of reaction during surgery   Other Other (See Comments)   Oxycodone-Acetaminophen Itching    Does not remember   Percocet [Oxycodone-Acetaminophen] Other (See Comments)    Does not remember   Vicodin [Hydrocodone-Acetaminophen] Other (See Comments)  Heart to flutter   Hydrocodone-Acetaminophen Palpitations    Heart to flutter    Family History  Problem Relation Age of Onset   Hypertension Mother    Breast cancer Mother        unsure of age   Heart disease Mother    COPD Mother    Hypertension Father    Heart disease Father    COPD Father    Diabetes Paternal Aunt    Diabetes Paternal Uncle    Breast cancer Maternal Aunt        unsure of age   Colon cancer Neg Hx    Esophageal cancer Neg Hx    Rectal cancer Neg Hx    Stomach cancer Neg Hx      Prior to Admission medications   Medication Sig Start Date End Date Taking? Authorizing Provider  aspirin EC 81 MG EC tablet Take 1 tablet (81 mg total) by mouth daily. 03/09/17   Jani Gravel, MD  Biotin  10000 MCG TABS Take 1 capsule by mouth daily.    [provider]  Calcium Citrate 200 MG TABS Take 200 mg by mouth daily.     [provider]  Cholecalciferol (VITAMIN D PO) Take 1 tablet by mouth daily.    [provider]  Cyanocobalamin (RA VITAMIN B-12 TR) 1000 MCG TBCR Take 1,000 mcg by mouth daily.     [provider]  dicyclomine (BENTYL) 20 MG tablet Take 20 mg by mouth 4 (four) times daily.    [provider]  Erenumab-aooe (AIMOVIG 140 DOSE) 70 MG/ML SOAJ Inject 140 mg into the skin every 30 (thirty) days. 01/21/18   Melvenia Beam, MD  escitalopram (LEXAPRO) 10 MG tablet Take 1 tablet (10 mg total) by mouth daily. 12/27/18   Anyanwu, Sallyanne Havers, MD  ferrous sulfate 325 (65 FE) MG tablet Take 325 mg by mouth daily with breakfast.    [provider]  hydrochlorothiazide (HYDRODIURIL) 25 MG tablet Take 25 mg by mouth daily.    [provider]  metoprolol succinate (TOPROL-XL) 25 MG 24 hr tablet Take 1 tablet (25 mg total) by mouth daily. 03/09/17   Jani Gravel, MD  Multiple Vitamin (MULTIVITAMIN) capsule Take 1 capsule by mouth 3 (three) times daily.     [provider]  nitroGLYCERIN (NITROSTAT) 0.4 MG SL tablet Place 1 tablet (0.4 mg total) under the tongue every 5 (five) minutes as needed for chest pain. 03/09/17   Jani Gravel, MD  pantoprazole (PROTONIX) 40 MG tablet Take 1 tablet (40 mg total) by mouth 2 (two) times daily. 03/09/17   Jani Gravel, MD  phentermine 30 MG capsule Take 30 mg by mouth daily.  04/25/17   [provider]  topiramate (TOPAMAX) 100 MG tablet Take 1 tablet (100 mg total) by mouth at bedtime. 11/22/17   Melvenia Beam, MD    Physical Exam: Vitals:   05/29/21 1930 05/29/21 1945 05/29/21 2000 05/29/21 2015  BP: (!) 121/102   (!) 155/105  Pulse: 98 96 86 88  Resp: 19 18 (!) 28 18  Temp:      TempSrc:      SpO2: 100% 100% 100% 100%  Weight:      Height:          Constitutional:  Acutely ill looking, weak, no distress Vitals:   05/29/21 1930 05/29/21 1945 05/29/21 2000 05/29/21 2015  BP: (!) 121/102   (!) 155/105  Pulse: 98 96 86 88  Resp:  19 18 (!) 28 18  Temp:      TempSrc:      SpO2: 100% 100% 100% 100%  Weight:      Height:       Eyes: PERRL, lids and conjunctivae normal ENMT: Mucous membranes are dry. Posterior pharynx clear of any exudate or lesions.Normal dentition.  Neck: normal, supple, no masses, no thyromegaly Respiratory: clear to auscultation bilaterally, no wheezing, no crackles. Normal respiratory effort. No accessory muscle use.  Cardiovascular: Irregularly irregular with tachycardia, no murmurs / rubs / gallops. No extremity edema. 2+ pedal pulses. No carotid bruits.  Abdomen: no tenderness, no masses palpated. No hepatosplenomegaly. Bowel sounds positive.  Musculoskeletal: no clubbing / cyanosis. No joint deformity upper and lower extremities. Good ROM, no contractures. Normal muscle tone.  Skin: no rashes, lesions, ulcers. No induration Neurologic: CN 2-12 grossly intact. Sensation intact, DTR normal. Strength 5/5 in all 4.  Psychiatric: Fully awake and alert, a little anxious    Labs on Admission: I have personally reviewed following labs and imaging studies  CBC: Recent Labs  Lab 05/29/21 1845  WBC 8.4  NEUTROABS 6.7  HGB 13.1  HCT 42.9  MCV 94.5  PLT 267*   Basic Metabolic Panel: Recent Labs  Lab 05/29/21 1845  NA 141  K 2.9*  CL 106  CO2 26  GLUCOSE 115*  BUN 13  CREATININE 0.59  CALCIUM 8.8*   GFR: Estimated Creatinine Clearance: 89.7 mL/min (by C-G formula based on SCr of 0.59 mg/dL). Liver Function Tests: Recent Labs  Lab 05/29/21 1845  AST 34  ALT 17  ALKPHOS 56  BILITOT 0.8  PROT 7.2  ALBUMIN 3.7   No results for input(s): LIPASE, AMYLASE in the last 168 hours. No results for input(s): AMMONIA in the last 168 hours. Coagulation Profile: No results for input(s): INR, PROTIME in the last 168  hours. Cardiac Enzymes: No results for input(s): CKTOTAL, CKMB, CKMBINDEX, TROPONINI in the last 168 hours. BNP (last 3 results) No results for input(s): PROBNP in the last 8760 hours. HbA1C: No results for input(s): HGBA1C in the last 72 hours. CBG: Recent Labs  Lab 05/29/21 1838  GLUCAP 101*   Lipid Profile: No results for input(s): CHOL, HDL, LDLCALC, TRIG, CHOLHDL, LDLDIRECT in the last 72 hours. Thyroid Function Tests: Recent Labs    05/29/21 1930  TSH 1.359   Anemia Panel: No results for input(s): VITAMINB12, FOLATE, FERRITIN, TIBC, IRON, RETICCTPCT in the last 72 hours. Urine analysis:    Component Value Date/Time   COLORURINE STRAW (A) 05/29/2021 1858   APPEARANCEUR CLEAR 05/29/2021 1858   LABSPEC 1.009 05/29/2021 1858   PHURINE 6.0 05/29/2021 1858   GLUCOSEU NEGATIVE 05/29/2021 1858   HGBUR SMALL (A) 05/29/2021 1858   BILIRUBINUR NEGATIVE 05/29/2021 1858   KETONESUR NEGATIVE 05/29/2021 1858   PROTEINUR NEGATIVE 05/29/2021 1858   UROBILINOGEN 0.2 03/01/2011 1905   NITRITE NEGATIVE 05/29/2021 1858   LEUKOCYTESUR NEGATIVE 05/29/2021 1858   Sepsis Labs: @LABRCNTIP (procalcitonin:4,lacticidven:4) ) Recent Results (from the past 240 hour(s))  Resp Panel by RT-PCR (Flu A&B, Covid) Nasopharyngeal Swab     Status: None   Collection Time: 05/29/21  7:27 PM   Specimen: Nasopharyngeal Swab; Nasopharyngeal(NP) swabs in vial transport medium  Result Value Ref Range Status   SARS Coronavirus 2 by RT PCR NEGATIVE NEGATIVE Final    Comment: (NOTE) SARS-CoV-2 target nucleic acids are NOT DETECTED.  The SARS-CoV-2 RNA is generally detectable in upper respiratory specimens during the acute phase of infection. The  lowest concentration of SARS-CoV-2 viral copies this assay can detect is 138 copies/mL. A negative result does not preclude SARS-Cov-2 infection and should not be used as the sole basis for treatment or other patient management decisions. A negative result may  occur with  improper specimen collection/handling, submission of specimen other than nasopharyngeal swab, presence of viral mutation(s) within the areas targeted by this assay, and inadequate number of viral copies(<138 copies/mL). A negative result must be combined with clinical observations, patient history, and epidemiological information. The expected result is Negative.  Fact Sheet for Patients:  EntrepreneurPulse.com.au  Fact Sheet for Healthcare Providers:  IncredibleEmployment.be  This test is no t yet approved or cleared by the [place] FDA and  has been authorized for detection and/or diagnosis of SARS-CoV-2 by FDA under an Emergency Use Authorization (EUA). This EUA will remain  in effect (meaning this test can be used) for the duration of the COVID-19 declaration under Section 564(b)(1) of the Act, 21 U.S.C.section 360bbb-3(b)(1), unless the authorization is terminated  or revoked sooner.       Influenza A by PCR NEGATIVE NEGATIVE Final   Influenza B by PCR NEGATIVE NEGATIVE Final    Comment: (NOTE) The Xpert Xpress SARS-CoV-2/FLU/RSV plus assay is intended as an aid in the diagnosis of influenza from Nasopharyngeal swab specimens and should not be used as a sole basis for treatment. Nasal washings and aspirates are unacceptable for Xpert Xpress SARS-CoV-2/FLU/RSV testing.  Fact Sheet for Patients: EntrepreneurPulse.com.au  Fact Sheet for Healthcare Providers: IncredibleEmployment.be  This test is not yet approved or cleared by the [place] FDA and has been authorized for detection and/or diagnosis of SARS-CoV-2 by FDA under an Emergency Use Authorization (EUA). This EUA will remain in effect (meaning this test can be used) for the duration of the COVID-19 declaration under Section 564(b)(1) of the Act, 21 U.S.C. section 360bbb-3(b)(1), unless the authorization is terminated  or revoked.  Performed at [facility], [place] [address] [place], [place] [address]      Radiological Exams on Admission: DG Chest Portable 1 View  Result Date: 05/29/2021 CLINICAL DATA:  Tachycardia EXAM: PORTABLE CHEST 1 VIEW COMPARISON:  11/23/2017 FINDINGS: Right basilar scarring is unchanged. Lungs are otherwise clear. No pneumothorax or pleural effusion. Cardiac size is within normal limits when accounting for patient positioning. Pulmonary vascularity is normal. No acute bone abnormality. IMPRESSION: No active disease. Electronically Signed   By: Fidela Salisbury MD   On: 05/29/2021 19:26    EKG: Independently reviewed.  Shows A. fib with RVR with rate in the 150s.  No significant ST changes  Assessment/Plan Principal Problem:   Atrial fibrillation (HCC) Active Problems:   History of deep vein thrombosis (DVT) of lower extremity   High cholesterol   Hypertensive urgency   Hypoglycemia   Hypokalemia     #1 A. fib with RVR: Appears to be new onset.  Patient denied any prior history of A. fib.  Will be admitted.  Initiated on heparin drip.  Patient will also be placed on oral metoprolol.  Get echocardiogram in the morning.  May require cardiology consultation.  Possible alcoholic cardiomyopathy.  TSH appears to be within normal  #2 hypertensive urgency: Blood pressure is improving.  Continue with the metoprolol.  #3 hypoglycemia: Suspect due to poor oral intake as well as associated with alcoholism.  Continue with glucose drip and monitor closely.  Not on insulin  #4 hypokalemia: Potassium 2.9.  Continue to replete  #5 hyperlipidemia: Not  on a statin.  Continue close monitoring  #6 alcoholism: Watch for possible withdrawal symptoms.  If evidence suggest start CIWA protocol.     DVT prophylaxis: Heparin drip Code Status: Full code Family Communication: Son at bedside Disposition Plan: Home Consults called: None Admission status: Inpatient  Severity  of Illness: The appropriate patient status for this patient is INPATIENT. Inpatient status is judged to be reasonable and necessary in order to provide the required intensity of service to ensure the patient's safety. The patient's presenting symptoms, physical exam findings, and initial radiographic and laboratory data in the context of their chronic comorbidities is felt to place them at high risk for further clinical deterioration. Furthermore, it is not anticipated that the patient will be medically stable for discharge from the hospital within 2 midnights of admission. The following factors support the patient status of inpatient.   " The patient's presenting symptoms include palpitations and low blood sugar. " The worrisome physical exam findings include A. fib with RVR. " The initial radiographic and laboratory data are worrisome because of blood sugar 40. " The chronic co-morbidities include status post gastric bypass.   * I certify that at the point of admission it is my clinical judgment that the patient will require inpatient hospital care spanning beyond 2 midnights from the point of admission due to high intensity of service, high risk for further deterioration and high frequency of surveillance required.Barbette Merino MD [facility] Pager [phone]  If 7PM-7AM, please contact night-coverage [email or web address] Password [facility]  05/29/2021, 9:57 PM

## 2021-05-29 NOTE — ED Notes (Signed)
Heart rate elevated to 188, pt reported feeling her heart race.  Dr. Jeanell Sparrow at the bedside and observed the increase in heart rate.

## 2021-05-29 NOTE — ED Provider Notes (Signed)
[facility] DEPT Provider Note   CSN: [id number] Arrival date & time: 05/29/21  1803     History Chief Complaint  Patient presents with   Hypoglycemia    [name] is a 55 y.o. female with a history of hypertension, migraines, DVT/PE.  Patient presents emerged department with a chief plaint of hypoglycemia.  Per EMS at bedside they were called by son who reports that patient's mother was "not acting right."  She was noted to be somnolent and diaphoretic.  CBG checked was 40.  Patient received 25 g of D10, repeat understands, and iced tea to drink.  CBG improved to 134.    Patient reports that she did not eat any food earlier today prior to the pain about her sandwich just prior to arrival.  Patient endorses having 2 shots of alcohol at approximately 0100 this morning.  Patient states that she drinks 4-5 shots daily.  Patient denies any illicit drug use.  She denies any recent falls or injuries.  Patient denies any known sick contacts.  At present patient endorses headache.  States that headache started last night.  Headache has gotten progressively worse over time.  Patient denies any aggravating or alleviating factors.  Patient reports that she has been having intermittent episodes of palpitations over the last month.  Patient denies any fever, chills, chest pain, shortness of breath, URI symptoms, abdominal pain, nausea, vomiting, diarrhea, dysuria, hematuria, urinary frequency, facial asymmetry, slurred speech, numbness, weakness.     Hypoglycemia Associated symptoms: no dizziness, no seizures, no shortness of breath, no speech difficulty, no tremors, no vomiting and no weakness       Past Medical History:  Diagnosis Date   Arthritis    Carpal tunnel syndrome    Chest pain 81/85/6314   Complication of anesthesia    " it makes me hallucinate "   Deep vein thrombosis (DVT) ([place])    Hypertension    Obesity    Pulmonary embolism ([place])     Vitamin D deficiency     Patient Active Problem List   Diagnosis Date Noted   Chronic migraine without aura, with intractable migraine, so stated, with status migrainosus 01/21/2018   Vestibular migraine 11/25/2017   Chest pain 03/07/2017   Hypertensive urgency 03/07/2017   Microcytic anemia 03/07/2017   Costochondritis 03/07/2017   Dysmenorrhea 09/15/2011   Morbid obesity ([place]) 06/09/2011   DUB (dysfunctional uterine bleeding) 06/09/2011   History of deep vein thrombosis (DVT) of lower extremity 06/09/2011   High blood pressure 06/09/2011   High cholesterol 06/09/2011    Past Surgical History:  Procedure Laterality Date   CARPAL TUNNEL RELEASE Left    CESAREAN SECTION     2 previous c-sections, [place]  06/2014   GASTRIC BYPASS  2015   KNEE SURGERY Left    LEFT HEART CATH  2012   TUBAL LIGATION       OB History     Gravida  5   Para  2   Term  1   Preterm  1   AB  3   Living  2      SAB  3   IAB      Ectopic      Multiple      Live Births              Family History  Problem Relation Age of Onset   Hypertension Mother    Breast  cancer Mother        unsure of age   Heart disease Mother    COPD Mother    Hypertension Father    Heart disease Father    COPD Father    Diabetes Paternal Aunt    Diabetes Paternal Uncle    Breast cancer Maternal Aunt        unsure of age   Colon cancer Neg Hx    Esophageal cancer Neg Hx    Rectal cancer Neg Hx    Stomach cancer Neg Hx     Social History   Tobacco Use   Smoking status: Never   Smokeless tobacco: Never  Vaping Use   Vaping Use: Never used  Substance Use Topics   Alcohol use: No   Drug use: No    Home Medications Prior to Admission medications   Medication Sig Start Date End Date Taking? Authorizing Provider  aspirin EC 81 MG EC tablet Take 1 tablet (81 mg total) by mouth daily. 03/09/17   Jani Gravel, MD  Biotin 10000 MCG TABS Take 1 capsule by mouth daily.     [provider]  Calcium Citrate 200 MG TABS Take 200 mg by mouth daily.     [provider]  Cholecalciferol (VITAMIN D PO) Take 1 tablet by mouth daily.    [provider]  Cyanocobalamin (RA VITAMIN B-12 TR) 1000 MCG TBCR Take 1,000 mcg by mouth daily.     [provider]  dicyclomine (BENTYL) 20 MG tablet Take 20 mg by mouth 4 (four) times daily.    [provider]  Erenumab-aooe (AIMOVIG 140 DOSE) 70 MG/ML SOAJ Inject 140 mg into the skin every 30 (thirty) days. 01/21/18   Melvenia Beam, MD  escitalopram (LEXAPRO) 10 MG tablet Take 1 tablet (10 mg total) by mouth daily. 12/27/18   Anyanwu, Sallyanne Havers, MD  ferrous sulfate 325 (65 FE) MG tablet Take 325 mg by mouth daily with breakfast.    [provider]  hydrochlorothiazide (HYDRODIURIL) 25 MG tablet Take 25 mg by mouth daily.    [provider]  metoprolol succinate (TOPROL-XL) 25 MG 24 hr tablet Take 1 tablet (25 mg total) by mouth daily. 03/09/17   Jani Gravel, MD  Multiple Vitamin (MULTIVITAMIN) capsule Take 1 capsule by mouth 3 (three) times daily.     [provider]  nitroGLYCERIN (NITROSTAT) 0.4 MG SL tablet Place 1 tablet (0.4 mg total) under the tongue every 5 (five) minutes as needed for chest pain. 03/09/17   Jani Gravel, MD  pantoprazole (PROTONIX) 40 MG tablet Take 1 tablet (40 mg total) by mouth 2 (two) times daily. 03/09/17   Jani Gravel, MD  phentermine 30 MG capsule Take 30 mg by mouth daily.  04/25/17   [provider]  topiramate (TOPAMAX) 100 MG tablet Take 1 tablet (100 mg total) by mouth at bedtime. 11/22/17   Melvenia Beam, MD    Allergies    Darvocet [propoxyphene n-acetaminophen], Other, Oxycodone-acetaminophen, Percocet [oxycodone-acetaminophen], Vicodin [hydrocodone-acetaminophen], and Hydrocodone-acetaminophen  Review of Systems   Review of Systems  Constitutional:  Negative for chills and fever.  HENT:  Negative for congestion,  rhinorrhea and sore throat.   Eyes:  Negative for visual disturbance.  Respiratory:  Negative for shortness of breath.   Cardiovascular:  Negative for chest pain.  Gastrointestinal:  Negative for abdominal pain, diarrhea, nausea and vomiting.  Genitourinary:  Negative for difficulty urinating, dysuria, frequency, hematuria, vaginal bleeding, vaginal discharge and  vaginal pain.  Musculoskeletal:  Negative for back pain, myalgias, neck pain and neck stiffness.  Skin:  Negative for color change and rash.  Neurological:  Positive for headaches. Negative for dizziness, tremors, seizures, syncope, facial asymmetry, speech difficulty, weakness, light-headedness and numbness.  Psychiatric/Behavioral:  Negative for confusion.    Physical Exam Updated Vital Signs There were no vitals taken for this visit.  Physical Exam Vitals and nursing note reviewed.  Constitutional:      General: She is not in acute distress.    Appearance: She is not ill-appearing, toxic-appearing or diaphoretic.  HENT:     Head: Normocephalic and atraumatic. No Battle's sign, abrasion, contusion, masses, right periorbital erythema, left periorbital erythema or laceration.     Jaw: No trismus or pain on movement.     Mouth/Throat:     Pharynx: Oropharynx is clear. Uvula midline. No pharyngeal swelling, oropharyngeal exudate, posterior oropharyngeal erythema or uvula swelling.  Eyes:     General: No scleral icterus.       Right eye: No discharge.        Left eye: No discharge.     Extraocular Movements: Extraocular movements intact.     Conjunctiva/sclera: Conjunctivae normal.     Pupils: Pupils are equal, round, and reactive to light.  Cardiovascular:     Rate and Rhythm: Normal rate.  Pulmonary:     Effort: Pulmonary effort is normal.  Abdominal:     General: There is no distension. There are no signs of injury.     Palpations: Abdomen is soft. There is no mass or pulsatile mass.     Tenderness: There is no  abdominal tenderness. There is no guarding or rebound.     Hernia: There is no hernia in the umbilical area or ventral area.  Musculoskeletal:     Cervical back: Normal range of motion and neck supple. No rigidity.     Right lower leg: Normal.     Left lower leg: Normal.  Skin:    General: Skin is warm and dry.     Coloration: Skin is not jaundiced or pale.  Neurological:     General: No focal deficit present.     Mental Status: She is alert and oriented to person, place, and time.     GCS: GCS eye subscore is 4. GCS verbal subscore is 5. GCS motor subscore is 6.     Cranial Nerves: No cranial nerve deficit or facial asymmetry.     Sensory: Sensation is intact.     Motor: No weakness, tremor, seizure activity or pronator drift.     Coordination: Finger-Nose-Finger Test normal.     Comments: CN II-XII intact; performed in supine position, +5 strength to bilateral upper extremities, +5 strength to dorsiflexion and plantarflexion, patient able to left both legs against gravity and hold each there without difficulty, sensation to light touch intact to bilateral upper and lower extremities  Psychiatric:        Behavior: Behavior is cooperative.    ED Results / Procedures / Treatments   Labs (all labs ordered are listed, but only abnormal results are displayed) Labs Reviewed  CBC WITH DIFFERENTIAL/PLATELET - Abnormal; Notable for the following components:      Result Value   Platelets 107 (*)    All other components within normal limits  COMPREHENSIVE METABOLIC PANEL - Abnormal; Notable for the following components:   Potassium 2.9 (*)    Glucose, Bld 115 (*)    Calcium 8.8 (*)  All other components within normal limits  CBG MONITORING, ED - Abnormal; Notable for the following components:   Glucose-Capillary 101 (*)    All other components within normal limits  RESP PANEL BY RT-PCR (FLU A&B, COVID) ARPGX2  RAPID URINE DRUG SCREEN, HOSP PERFORMED  ETHANOL  URINALYSIS, ROUTINE W  REFLEX MICROSCOPIC  TSH    EKG EKG Interpretation  Date/Time:  Sunday May 29 2021 18:29:46 EDT Ventricular Rate:  151 PR Interval:    QRS Duration: 71 QT Interval:  314 QTC Calculation: 498 R Axis:   44 Text Interpretation: Atrial fibrillation Anteroseptal infarct, old Nonspecific repol abnormality, diffuse leads Confirmed by Pattricia Boss [phone]) on 05/29/2021 7:10:56 PM  Radiology DG Chest Portable 1 View  Result Date: 05/29/2021 CLINICAL DATA:  Tachycardia EXAM: PORTABLE CHEST 1 VIEW COMPARISON:  11/23/2017 FINDINGS: Right basilar scarring is unchanged. Lungs are otherwise clear. No pneumothorax or pleural effusion. Cardiac size is within normal limits when accounting for patient positioning. Pulmonary vascularity is normal. No acute bone abnormality. IMPRESSION: No active disease. Electronically Signed   By: Fidela Salisbury MD   On: 05/29/2021 19:26    Procedures .Critical Care  Date/Time: 05/29/2021 8:27 PM Performed by: Loni Beckwith, PA-C Authorized by: Loni Beckwith, PA-C   Critical care provider statement:    Critical care time (minutes):  45   Critical care was necessary to treat or prevent imminent or life-threatening deterioration of the following conditions:  Cardiac failure   Critical care was time spent personally by me on the following activities:  Evaluation of patient's response to treatment, examination of patient, ordering and performing treatments and interventions, ordering and review of laboratory studies, ordering and review of radiographic studies, pulse oximetry, re-evaluation of patient's condition, obtaining history from patient or surrogate and review of old charts   Care discussed with: admitting provider     Medications Ordered in ED Medications  potassium chloride SA (KLOR-CON) CR tablet 40 mEq (has no administration in time range)  metoprolol tartrate (LOPRESSOR) injection 5 mg (has no administration in time range)  0.9 %  sodium  chloride infusion (has no administration in time range)    ED Course  I have reviewed the triage vital signs and the nursing notes.  Pertinent labs & imaging results that were available during my care of the patient were reviewed by me and considered in my medical decision making (see chart for details).    MDM Rules/Calculators/A&P                          Alert 55 year old female no acute distress, nontoxic-appearing.  Patient presents to the emergency department with a chief complaint of hypoglycemia.  She has no history of diabetes.  Patient states that she had nothing to eat earlier today.  Patient denies any recent falls or injuries.  No illicit drug use.  No known sick contacts.  Patient reports that she has been having remittent episodes of palpitations over the last month  Patient denies any facial asymmetry, slurred speech, numbness, weakness, visual disturbance.  No neurological deficits on physical exam.  Patient is alert to person, place, and time.  Will recheck CBG, plan to obtain UA and basic lab work.    Repeat CBG 101  EKG showed atrial fibrillation ventricular rate of 170.   Patient spontaneously converted back to normal sinus rhythm.  Noted to have multiple episodes of this while in the emergency department.  We will start  patient on heparin for anticoagulation, will give dose of Lopressor help with rate control.  CBC unremarkable CMP shows potassium at 2.9, will replete orally. UDS negative, Ethanol within normal limits UA shows no signs of infection or dehydration  Will consult hospitalist for admission due to patient's unknown cause of hypoglycemia and runs of A. fib with RVR.  2026 spoke to Dr. Jonelle Sidle who will see the patient for admission.  Patient was discussed with and evaluated by Dr. Jeanell Sparrow.   Final Clinical Impression(s) / ED Diagnoses Final diagnoses:  Hypoglycemia  Atrial fibrillation, unspecified type [facility])    Rx / DC Orders ED Discharge Orders      None        Dyann Ruddle 05/29/21 2028    Pattricia Boss, MD 05/30/21 1557

## 2021-05-29 NOTE — ED Triage Notes (Signed)
Pt bib [facility] from home d/t hypoglycemia.  Per EMS upon arrival pt's CBG was 40.  IV established and 25 g D10 given.  Pt also given snapple, crackers and peanut butter.  EMS reports that CBG went up to 224 dropped back down to 134.  Pt's son reported that pt was not acting like herself and being lethargic.

## 2021-05-30 ENCOUNTER — Inpatient Hospital Stay (HOSPITAL_COMMUNITY): Payer: 59

## 2021-05-30 DIAGNOSIS — E162 Hypoglycemia, unspecified: Secondary | ICD-10-CM | POA: Diagnosis not present

## 2021-05-30 DIAGNOSIS — E876 Hypokalemia: Secondary | ICD-10-CM

## 2021-05-30 DIAGNOSIS — I160 Hypertensive urgency: Secondary | ICD-10-CM | POA: Diagnosis not present

## 2021-05-30 DIAGNOSIS — I4891 Unspecified atrial fibrillation: Secondary | ICD-10-CM

## 2021-05-30 LAB — CBC
HCT: 43.6 % (ref 36.0–46.0)
Hemoglobin: 13.6 g/dL (ref 12.0–15.0)
MCH: 28.9 pg (ref 26.0–34.0)
MCHC: 31.2 g/dL (ref 30.0–36.0)
MCV: 92.8 fL (ref 80.0–100.0)
Platelets: 239 10*3/uL (ref 150–400)
RBC: 4.7 MIL/uL (ref 3.87–5.11)
RDW: 14 % (ref 11.5–15.5)
WBC: 7.1 10*3/uL (ref 4.0–10.5)
nRBC: 0 % (ref 0.0–0.2)

## 2021-05-30 LAB — BASIC METABOLIC PANEL
Anion gap: 8 (ref 5–15)
BUN: 14 mg/dL (ref 6–20)
CO2: 28 mmol/L (ref 22–32)
Calcium: 9.5 mg/dL (ref 8.9–10.3)
Chloride: 104 mmol/L (ref 98–111)
Creatinine, Ser: 0.69 mg/dL (ref 0.44–1.00)
GFR, Estimated: >60 mL/min (ref >60)
Glucose, Bld: 94 mg/dL (ref 70–99)
Potassium: 3.6 mmol/L (ref 3.5–5.1)
Sodium: 140 mmol/L (ref 135–145)

## 2021-05-30 LAB — ECHOCARDIOGRAM COMPLETE
Area-P 1/2: 4.15 cm2
Height: 67 in
S' Lateral: 3 cm
Weight: 3200 oz

## 2021-05-30 LAB — LIPID PANEL
Cholesterol: 188 mg/dL (ref 0–200)
HDL: 84 mg/dL (ref >40)
LDL Cholesterol: 97 mg/dL (ref 0–99)
Total CHOL/HDL Ratio: 2.2 RATIO
Triglycerides: 34 mg/dL (ref <150)
VLDL: 7 mg/dL (ref 0–40)

## 2021-05-30 LAB — TSH: TSH: 1.905 u[IU]/mL (ref 0.350–4.500)

## 2021-05-30 LAB — HEPARIN LEVEL (UNFRACTIONATED)
Heparin Unfractionated: 0.44 IU/mL (ref 0.30–0.70)
Heparin Unfractionated: 0.67 IU/mL (ref 0.30–0.70)

## 2021-05-30 LAB — CORTISOL-AM, BLOOD: Cortisol - AM: 13 ug/dL (ref 6.7–22.6)

## 2021-05-30 LAB — MAGNESIUM: Magnesium: 2 mg/dL (ref 1.7–2.4)

## 2021-05-30 LAB — HIV ANTIBODY (ROUTINE TESTING W REFLEX): HIV Screen 4th Generation wRfx: NONREACTIVE

## 2021-05-30 LAB — CBG MONITORING, ED
Glucose-Capillary: 69 mg/dL — ABNORMAL LOW (ref 70–99)
Glucose-Capillary: 89 mg/dL (ref 70–99)
Glucose-Capillary: 95 mg/dL (ref 70–99)

## 2021-05-30 LAB — T4, FREE: Free T4: 0.89 ng/dL (ref 0.61–1.12)

## 2021-05-30 LAB — GLUCOSE, CAPILLARY: Glucose-Capillary: 100 mg/dL — ABNORMAL HIGH (ref 70–99)

## 2021-05-30 MED ORDER — NITROGLYCERIN 0.4 MG SL SUBL: 0.4000 mg | SUBLINGUAL_TABLET | SUBLINGUAL | Status: DC | PRN

## 2021-05-30 MED ORDER — GABAPENTIN 300 MG PO CAPS
300.0000 mg | ORAL_CAPSULE | Freq: Three times a day (TID) | ORAL | Status: DC
  Administered 2021-05-30 – 2021-05-31 (×6): 300 mg via ORAL
  Filled 2021-05-30 (×7): qty 1

## 2021-05-30 MED ORDER — DEXTROSE-NACL 5-0.45 % IV SOLN: INTRAVENOUS | Status: DC

## 2021-05-30 MED ORDER — METOPROLOL SUCCINATE ER 25 MG PO TB24
25.0000 mg | ORAL_TABLET | Freq: Every day | ORAL | Status: DC
  Administered 2021-05-30 – 2021-06-01 (×3): 25 mg via ORAL
  Filled 2021-05-30 (×3): qty 1

## 2021-05-30 MED ORDER — ENOXAPARIN SODIUM 100 MG/ML IJ SOSY
90.0000 mg | PREFILLED_SYRINGE | Freq: Two times a day (BID) | INTRAMUSCULAR | Status: DC
  Administered 2021-05-30 – 2021-05-31 (×2): 90 mg via SUBCUTANEOUS
  Filled 2021-05-30 (×2): qty 1

## 2021-05-30 MED ORDER — SERTRALINE HCL 100 MG PO TABS
200.0000 mg | ORAL_TABLET | Freq: Every day | ORAL | Status: DC
  Administered 2021-05-30 – 2021-05-31 (×2): 200 mg via ORAL
  Filled 2021-05-30 (×2): qty 2
  Filled 2021-05-30: qty 4

## 2021-05-30 MED ORDER — VITAMIN B-12 1000 MCG PO TABS
1000.0000 ug | ORAL_TABLET | Freq: Every day | ORAL | Status: DC
  Administered 2021-05-31 – 2021-06-01 (×2): 1000 ug via ORAL
  Filled 2021-05-30 (×4): qty 1

## 2021-05-30 MED ORDER — ADULT MULTIVITAMIN W/MINERALS CH
1.0000 | ORAL_TABLET | Freq: Three times a day (TID) | ORAL | Status: DC
  Administered 2021-05-30 – 2021-05-31 (×4): 1 via ORAL
  Filled 2021-05-30 (×4): qty 1

## 2021-05-30 MED ORDER — IRON 21/7 PO MISC
Freq: Every day | ORAL | Status: DC
Start: 2021-05-30 — End: 2021-05-31

## 2021-05-30 MED ORDER — ACETAMINOPHEN 325 MG PO TABS
650.0000 mg | ORAL_TABLET | Freq: Four times a day (QID) | ORAL | Status: DC | PRN
  Administered 2021-05-30: 650 mg via ORAL
  Filled 2021-05-30 (×2): qty 2

## 2021-05-30 MED ORDER — PANTOPRAZOLE SODIUM 40 MG PO TBEC
40.0000 mg | DELAYED_RELEASE_TABLET | Freq: Two times a day (BID) | ORAL | Status: DC
  Administered 2021-05-30 – 2021-06-01 (×5): 40 mg via ORAL
  Filled 2021-05-30 (×5): qty 1

## 2021-05-30 MED ORDER — HEPARIN (PORCINE) 25000 UT/250ML-% IV SOLN
1150.0000 [IU]/h | INTRAVENOUS | Status: DC
  Filled 2021-05-30: qty 250

## 2021-05-30 MED ORDER — CYCLOSPORINE 0.05 % OP EMUL
1.0000 [drp] | Freq: Two times a day (BID) | OPHTHALMIC | Status: DC
  Administered 2021-05-30 – 2021-06-01 (×4): 1 [drp] via OPHTHALMIC
  Filled 2021-05-30 (×7): qty 1

## 2021-05-30 MED ORDER — CYCLOBENZAPRINE HCL 10 MG PO TABS
10.0000 mg | ORAL_TABLET | Freq: Two times a day (BID) | ORAL | Status: DC | PRN
  Administered 2021-05-31 – 2021-06-01 (×3): 10 mg via ORAL
  Filled 2021-05-30 (×3): qty 1

## 2021-05-30 MED ORDER — HYDROXYZINE HCL 25 MG PO TABS
25.0000 mg | ORAL_TABLET | Freq: Three times a day (TID) | ORAL | Status: DC
  Administered 2021-05-30 – 2021-06-01 (×7): 25 mg via ORAL
  Filled 2021-05-30 (×7): qty 1

## 2021-05-30 MED ORDER — CALCIUM CITRATE 950 (200 CA) MG PO TABS
950.0000 mg | ORAL_TABLET | Freq: Every day | ORAL | Status: DC
  Administered 2021-05-31: 950 mg via ORAL
  Filled 2021-05-30 (×2): qty 1

## 2021-05-30 MED ORDER — ENSURE ENLIVE PO LIQD: 237.0000 mL | Freq: Two times a day (BID) | ORAL | Status: DC

## 2021-05-30 MED ORDER — TRAMADOL HCL 50 MG PO TABS
50.0000 mg | ORAL_TABLET | Freq: Four times a day (QID) | ORAL | Status: DC | PRN
Start: 2021-05-30 — End: 2021-06-01
  Filled 2021-05-30 (×2): qty 1

## 2021-05-30 MED ORDER — DEXTROSE-NACL 5-0.9 % IV SOLN: INTRAVENOUS | Status: DC

## 2021-05-30 MED ORDER — POTASSIUM CHLORIDE CRYS ER 20 MEQ PO TBCR
40.0000 meq | EXTENDED_RELEASE_TABLET | Freq: Two times a day (BID) | ORAL | Status: AC
  Administered 2021-05-30 (×2): 40 meq via ORAL
  Filled 2021-05-30 (×2): qty 2

## 2021-05-30 MED ORDER — TRAZODONE HCL 100 MG PO TABS
100.0000 mg | ORAL_TABLET | Freq: Every evening | ORAL | Status: DC | PRN
  Administered 2021-05-31: 200 mg via ORAL
  Filled 2021-05-30: qty 2

## 2021-05-30 MED ORDER — ONDANSETRON HCL 4 MG/2ML IJ SOLN
4.0000 mg | Freq: Four times a day (QID) | INTRAMUSCULAR | Status: DC | PRN
  Administered 2021-05-30: 4 mg via INTRAVENOUS
  Filled 2021-05-30: qty 2

## 2021-05-30 MED ORDER — MORPHINE SULFATE (PF) 2 MG/ML IV SOLN
2.0000 mg | Freq: Once | INTRAVENOUS | Status: AC
Start: 2021-05-30 — End: 2021-05-30
  Administered 2021-05-30: 2 mg via INTRAVENOUS
  Filled 2021-05-30: qty 1

## 2021-05-30 MED ORDER — OXYCODONE HCL 5 MG PO TABS
5.0000 mg | ORAL_TABLET | ORAL | Status: DC | PRN
  Administered 2021-05-30: 5 mg via ORAL
  Administered 2021-05-30: 10 mg via ORAL
  Administered 2021-05-31 – 2021-06-01 (×2): 5 mg via ORAL
  Filled 2021-05-30: qty 1
  Filled 2021-05-30: qty 2
  Filled 2021-05-30: qty 1
  Filled 2021-05-30: qty 2
  Filled 2021-05-30: qty 1

## 2021-05-30 MED ORDER — LIFITEGRAST 5 % OP SOLN: 1.0000 [drp] | Freq: Every day | OPHTHALMIC | Status: DC

## 2021-05-30 NOTE — Progress Notes (Signed)
ANTICOAGULATION CONSULT NOTE   Pharmacy Consult for heparin Indication: atrial fibrillation  Allergies  Allergen Reactions   Darvocet [Propoxyphene N-Acetaminophen] Other (See Comments)    Some kind of reaction during surgery   Other Other (See Comments)   Oxycodone-Acetaminophen Itching    Does not remember   Percocet [Oxycodone-Acetaminophen] Other (See Comments)    Does not remember   Vicodin [Hydrocodone-Acetaminophen] Other (See Comments)    Heart to flutter   Hydrocodone-Acetaminophen Palpitations    Heart to flutter    Patient Measurements: Height: 5\' 7"  (170.2 cm) Weight: 90.7 kg (200 lb) IBW/kg (Calculated) : 61.6 Heparin Dosing Weight: 81 kg  Vital Signs: BP: 167/129 (07/18 0530) Pulse Rate: 86 (07/18 0530)  Labs: Recent Labs    05/29/21 1845 05/30/21 0552  HGB 13.1 13.6  HCT 42.9 43.6  PLT 107* 239  HEPARINUNFRC  --  0.44  CREATININE 0.59  --      Estimated Creatinine Clearance: 89.7 mL/min (by C-G formula based on SCr of 0.59 mg/dL).   Assessment: Patient is a 55 y.o F presented to the ED on 05/29/21 with hypoglycemia.  She was subsequently found to be in Afib.  Pharmacy has been consulted to start heparin drip for stroke prevention.  Today, 05/30/21 HL is 0.44, therapeutic  Hgb 13.6, plt 239  SCr < 1  No line or bleeding issues per RN     Goal of Therapy:  Heparin level 0.3-0.7 units/ml Monitor platelets by anticoagulation protocol: Yes   Plan:  Continue heparin infusion at 1200 units/hr Obtain confirmatory 6 hr heparin level Monitor for s/sx bleeding    Royetta Asal, PharmD, BCPS 05/30/2021 6:27 AM

## 2021-05-30 NOTE — Progress Notes (Signed)
[place] for heparin--->Lovenox Indication: atrial fibrillation  Allergies  Allergen Reactions   Darvocet [Propoxyphene N-Acetaminophen] Other (See Comments)    Some kind of reaction during surgery   Other Other (See Comments)   Oxycodone-Acetaminophen Itching    Does not remember   Percocet [Oxycodone-Acetaminophen] Other (See Comments)    Does not remember   Vicodin [Hydrocodone-Acetaminophen] Other (See Comments)    Heart to flutter   Hydrocodone-Acetaminophen Palpitations    Heart to flutter    Patient Measurements: Height: 5\' 7"  (170.2 cm) Weight: 90.7 kg (200 lb) IBW/kg (Calculated) : 61.6 Heparin Dosing Weight: 81 kg  Vital Signs: Temp: 98.8 F (37.1 C) (07/18 1601) Temp Source: Oral (07/18 1601) BP: 167/100 (07/18 1601) Pulse Rate: 80 (07/18 1601)  Labs: Recent Labs    05/29/21 1845 05/30/21 0552 05/30/21 1245  HGB 13.1 13.6  --   HCT 42.9 43.6  --   PLT 107* 239  --   HEPARINUNFRC  --  0.44 0.67  CREATININE 0.59  --  0.69     Estimated Creatinine Clearance: 89.7 mL/min (by C-G formula based on SCr of 0.69 mg/dL).   Assessment: Patient is a 55 y.o F presented to the ED on 05/29/21 with hypoglycemia.  She was subsequently found to be in Afib.  Currently on IV heparin with therapeutic level x2.  Pharmacy consulted to transition to Lovenox with plans for DOAC at discharge.   CBC WNL with no bleeding noted.  Scr <0.8   Goal of Therapy:  Heparin level 0.3-0.7 units/ml Monitor platelets by anticoagulation protocol: Yes   Plan:  Lovenox 90mg  SQ q12h CBC q3 days on Lovenox Monitor closely for s/sx of bleeding F/U plans for long-term anticoagulation  Netta Cedars PharmD, BCPS Clinical Pharmacist 05/30/2021 6:53 PM

## 2021-05-30 NOTE — Progress Notes (Signed)
ANTICOAGULATION CONSULT NOTE   Pharmacy Consult for heparin Indication: atrial fibrillation  Allergies  Allergen Reactions   Darvocet [Propoxyphene N-Acetaminophen] Other (See Comments)    Some kind of reaction during surgery   Other Other (See Comments)   Oxycodone-Acetaminophen Itching    Does not remember   Percocet [Oxycodone-Acetaminophen] Other (See Comments)    Does not remember   Vicodin [Hydrocodone-Acetaminophen] Other (See Comments)    Heart to flutter   Hydrocodone-Acetaminophen Palpitations    Heart to flutter    Patient Measurements: Height: 5\' 7"  (170.2 cm) Weight: 90.7 kg (200 lb) IBW/kg (Calculated) : 61.6 Heparin Dosing Weight: 81 kg  Vital Signs: BP: 151/103 (07/18 1150) Pulse Rate: 72 (07/18 1150)  Labs: Recent Labs    05/29/21 1845 05/30/21 0552 05/30/21 1245  HGB 13.1 13.6  --   HCT 42.9 43.6  --   PLT 107* 239  --   HEPARINUNFRC  --  0.44 0.67  CREATININE 0.59  --   --      Estimated Creatinine Clearance: 89.7 mL/min (by C-G formula based on SCr of 0.59 mg/dL).   Assessment: Patient is a 55 y.o F presented to the ED on 05/29/21 with hypoglycemia.  She was subsequently found to be in Afib.  Pharmacy has been consulted to start heparin drip for stroke prevention.  Today, 05/30/21 1245 heparin level = 0.67 units/mL, remains therapeutic but on upper end of goal range  Hgb 13.6, Pltc 239  SCr < 1  No bleeding or infusion issues noted per nursing   Goal of Therapy:  Heparin level 0.3-0.7 units/ml Monitor platelets by anticoagulation protocol: Yes   Plan:  Decrease heparin infusion slightly to 1150 units/hr Daily CBC, heparin level Monitor closely for s/sx of bleeding   Lindell Spar, PharmD, BCPS Clinical Pharmacist 05/30/2021 1:31 PM

## 2021-05-30 NOTE — Progress Notes (Signed)
  Echocardiogram 2D Echocardiogram has been performed.  [name] 05/30/2021, 2:43 PM

## 2021-05-30 NOTE — ED Notes (Signed)
Pt transferred to hospital bed

## 2021-05-30 NOTE — Progress Notes (Signed)
[name]  MPN:[id number] DOB: [birth year] DOA: 05/29/2021 PCP: Andria Frames, PA-C    Brief Narrative:  [phone] w/ hx of HTN, DVT/PE, palpitations on BB, gastric bypass surgery, excess alcohol consumption (4-5 shots per day), and complex migraines who presented to the ED w/ hypoglycemia and palpitations. Family called her and noted she was altered. EMS arrived and discovered CBG of 40. Upon ER arrival she was found to be in Afib.   Consultants:  None  Code Status: FULL CODE  Antimicrobials:  none  DVT prophylaxis: Heparin gtt   Subjective: States she continues to feel intermittent palpitations. Denies cp, sob, n/v, or abdom pain. Feels weak in general. Reports only modest appetite. C/o muskuloskeletal type pain in the L neck/shoulder.   Assessment & Plan:  Newly diagnosed Afib w/ RVR TSH normal - rate now controlled - monitor on telemetry - check Mg - transition to lovenox for anticoag - check pricing of eliquis w/ help from [facility]   Hypoglycemia  Recurring since admission - cont dextrose in IVF for now - check serum cortisol   Hypokalemia  Supplement to goal of 4.0   Alcoholism No evidence of withdrawal at this time - follow   HLD Not on a statin at home   Uncontrolled HTN Adjust medical tx and follow trend  Pain in L neck/shoulder Prn pain meds - trial of oral narcotic as pt describes allergy as "palpitations" instead of a true histaminergic type reaction    Family Communication: no family present at time of admit  Status is: Inpatient  Remains inpatient appropriate because:Inpatient level of care appropriate due to severity of illness  Dispo: The patient is from: Home              Anticipated d/c is to: Home              Patient currently is not medically stable to d/c.   Difficult to place patient No   Objective: Blood pressure (!) 167/129, pulse 86, temperature 98.2 F (36.8 C), temperature source Oral, resp. rate (!) 22, height 5\' 7"  (1.702 m),  weight 90.7 kg, SpO2 99 %. No intake or output data in the 24 hours ending 05/30/21 0808 Filed Weights   05/29/21 1828  Weight: 90.7 kg    Examination: General: No acute respiratory distress Lungs: Clear to auscultation bilaterally without wheezes or crackles Cardiovascular: Regular rate and rhythm without murmur gallop or rub normal S1 and S2 Abdomen: Nontender, nondistended, soft, bowel sounds positive, no rebound, no ascites, no appreciable mass Extremities: No significant cyanosis, clubbing, or edema bilateral lower extremities  CBC: Recent Labs  Lab 05/29/21 1845 05/30/21 0552  WBC 8.4 7.1  NEUTROABS 6.7  --   HGB 13.1 13.6  HCT 42.9 43.6  MCV 94.5 92.8  PLT 107* 761   Basic Metabolic Panel: Recent Labs  Lab 05/29/21 1845  NA 141  K 2.9*  CL 106  CO2 26  GLUCOSE 115*  BUN 13  CREATININE 0.59  CALCIUM 8.8*   GFR: Estimated Creatinine Clearance: 89.7 mL/min (by C-G formula based on SCr of 0.59 mg/dL).  Liver Function Tests: Recent Labs  Lab 05/29/21 1845  AST 34  ALT 17  ALKPHOS 56  BILITOT 0.8  PROT 7.2  ALBUMIN 3.7    HbA1C: Hgb A1c MFr Bld  Date/Time Value Ref Range Status  01/24/2009 01:45 PM  4.6 - 6.1 % Final   5.9 (NOTE)   The ADA recommends the following therapeutic goal for  glycemic   control related to Hgb A1C measurement:   Goal of Therapy:   < 7.0% Hgb A1C   Reference: American Diabetes Association: Clinical Practice   Recommendations 2008, Diabetes Care,  2008, 31:(Suppl 1).    CBG: Recent Labs  Lab 05/29/21 1838 05/29/21 2217 05/30/21 0029  GLUCAP 101* 73 89    Recent Results (from the past 240 hour(s))  Resp Panel by RT-PCR (Flu A&B, Covid) Nasopharyngeal Swab     Status: None   Collection Time: 05/29/21  7:27 PM   Specimen: Nasopharyngeal Swab; Nasopharyngeal(NP) swabs in vial transport medium  Result Value Ref Range Status   SARS Coronavirus 2 by RT PCR NEGATIVE NEGATIVE Final    Comment: (NOTE) SARS-CoV-2 target  nucleic acids are NOT DETECTED.  The SARS-CoV-2 RNA is generally detectable in upper respiratory specimens during the acute phase of infection. The lowest concentration of SARS-CoV-2 viral copies this assay can detect is 138 copies/mL. A negative result does not preclude SARS-Cov-2 infection and should not be used as the sole basis for treatment or other patient management decisions. A negative result may occur with  improper specimen collection/handling, submission of specimen other than nasopharyngeal swab, presence of viral mutation(s) within the areas targeted by this assay, and inadequate number of viral copies(<138 copies/mL). A negative result must be combined with clinical observations, patient history, and epidemiological information. The expected result is Negative.  Fact Sheet for Patients:  EntrepreneurPulse.com.au  Fact Sheet for Healthcare Providers:  IncredibleEmployment.be  This test is no t yet approved or cleared by the [place] FDA and  has been authorized for detection and/or diagnosis of SARS-CoV-2 by FDA under an Emergency Use Authorization (EUA). This EUA will remain  in effect (meaning this test can be used) for the duration of the COVID-19 declaration under Section 564(b)(1) of the Act, 21 U.S.C.section 360bbb-3(b)(1), unless the authorization is terminated  or revoked sooner.       Influenza A by PCR NEGATIVE NEGATIVE Final   Influenza B by PCR NEGATIVE NEGATIVE Final    Comment: (NOTE) The Xpert Xpress SARS-CoV-2/FLU/RSV plus assay is intended as an aid in the diagnosis of influenza from Nasopharyngeal swab specimens and should not be used as a sole basis for treatment. Nasal washings and aspirates are unacceptable for Xpert Xpress SARS-CoV-2/FLU/RSV testing.  Fact Sheet for Patients: EntrepreneurPulse.com.au  Fact Sheet for Healthcare  Providers: IncredibleEmployment.be  This test is not yet approved or cleared by the [place] FDA and has been authorized for detection and/or diagnosis of SARS-CoV-2 by FDA under an Emergency Use Authorization (EUA). This EUA will remain in effect (meaning this test can be used) for the duration of the COVID-19 declaration under Section 564(b)(1) of the Act, 21 U.S.C. section 360bbb-3(b)(1), unless the authorization is terminated or revoked.  Performed at [facility], [place] [address], [place], [place] [address]      Scheduled Meds:  calcium citrate  950 mg Oral Daily   cycloSPORINE  1 drop Both Eyes BID   gabapentin  300 mg Oral TID   hydrOXYzine  25 mg Oral TID   Iron 21/7   Oral Daily   Lifitegrast  1 drop Both Eyes Daily   multivitamin with minerals  1 tablet Oral TID   pantoprazole  40 mg Oral BID   sertraline  200 mg Oral Daily   vitamin B-12  1,000 mcg Oral Daily   Continuous Infusions:  sodium chloride     heparin 1,200 Units/hr (05/29/21  2140)     LOS: 1 day   Cherene Altes, MD [facility] Office  [phone] Pager - Text Page per Amion  If 7PM-7AM, please contact night-coverage per Amion 05/30/2021, 8:08 AM

## 2021-05-30 NOTE — ED Notes (Signed)
Pt BGL at 0815 69. Pt a&ox4. Denies complaints. Pt given orange juice, crackers. Pt BGL at 0845 95. Pt denies complaint. Pt given breakfast tray

## 2021-05-31 ENCOUNTER — Inpatient Hospital Stay (HOSPITAL_COMMUNITY): Payer: 59

## 2021-05-31 ENCOUNTER — Other Ambulatory Visit: Payer: Self-pay | Admitting: Cardiology

## 2021-05-31 DIAGNOSIS — I160 Hypertensive urgency: Secondary | ICD-10-CM | POA: Diagnosis not present

## 2021-05-31 DIAGNOSIS — I471 Supraventricular tachycardia: Secondary | ICD-10-CM

## 2021-05-31 DIAGNOSIS — I4891 Unspecified atrial fibrillation: Secondary | ICD-10-CM | POA: Diagnosis not present

## 2021-05-31 DIAGNOSIS — E162 Hypoglycemia, unspecified: Secondary | ICD-10-CM | POA: Diagnosis not present

## 2021-05-31 DIAGNOSIS — E876 Hypokalemia: Secondary | ICD-10-CM | POA: Diagnosis not present

## 2021-05-31 LAB — COMPREHENSIVE METABOLIC PANEL
ALT: 15 U/L (ref 0–44)
AST: 25 U/L (ref 15–41)
Albumin: 3.2 g/dL — ABNORMAL LOW (ref 3.5–5.0)
Alkaline Phosphatase: 51 U/L (ref 38–126)
Anion gap: 8 (ref 5–15)
BUN: 12 mg/dL (ref 6–20)
CO2: 29 mmol/L (ref 22–32)
Calcium: 9.4 mg/dL (ref 8.9–10.3)
Chloride: 106 mmol/L (ref 98–111)
Creatinine, Ser: 0.8 mg/dL (ref 0.44–1.00)
GFR, Estimated: >60 mL/min (ref >60)
Glucose, Bld: 112 mg/dL — ABNORMAL HIGH (ref 70–99)
Potassium: 4.2 mmol/L (ref 3.5–5.1)
Sodium: 143 mmol/L (ref 135–145)
Total Bilirubin: 0.6 mg/dL (ref 0.3–1.2)
Total Protein: 6.4 g/dL — ABNORMAL LOW (ref 6.5–8.1)

## 2021-05-31 LAB — D-DIMER, QUANTITATIVE: D-Dimer, Quant: <0.27 ug/mL-FEU (ref 0.00–0.50)

## 2021-05-31 LAB — MAGNESIUM: Magnesium: 1.9 mg/dL (ref 1.7–2.4)

## 2021-05-31 MED ORDER — CENTRUM PO CHEW
1.0000 | CHEWABLE_TABLET | Freq: Every day | ORAL | Status: DC
  Administered 2021-05-31 – 2021-06-01 (×2): 1 via ORAL
  Filled 2021-05-31 (×3): qty 1

## 2021-05-31 MED ORDER — ENSURE ENLIVE PO LIQD
237.0000 mL | Freq: Two times a day (BID) | ORAL | Status: DC
  Administered 2021-05-31 – 2021-06-01 (×2): 237 mL via ORAL

## 2021-05-31 MED ORDER — CALCIUM CARBONATE ANTACID 500 MG PO CHEW
1.0000 | CHEWABLE_TABLET | Freq: Three times a day (TID) | ORAL | Status: DC
  Administered 2021-05-31: 200 mg via ORAL
  Filled 2021-05-31 (×2): qty 1

## 2021-05-31 MED ORDER — ENOXAPARIN SODIUM 40 MG/0.4ML IJ SOSY
40.0000 mg | PREFILLED_SYRINGE | INTRAMUSCULAR | Status: DC
  Administered 2021-06-01: 40 mg via SUBCUTANEOUS
  Filled 2021-05-31: qty 0.4

## 2021-05-31 MED ORDER — PROSOURCE PLUS PO LIQD
30.0000 mL | Freq: Two times a day (BID) | ORAL | Status: DC
  Administered 2021-05-31: 30 mL via ORAL
  Filled 2021-05-31 (×3): qty 30

## 2021-05-31 NOTE — Progress Notes (Signed)
Initial Nutrition Assessment  DOCUMENTATION CODES:   Obesity unspecified  INTERVENTION:  - continue Ensure Enlive BID, each supplement provides 350 kcal and 20 grams of protein. - will order 30 ml Prosource Plus BID, each supplement provides 100 kcal and 15 grams protein.  - will change calcium supplementation to 500 mg calcium carbonate (Tums) TID. - will change multivitamin order to one table bariatric multivitamin once/day.   NUTRITION DIAGNOSIS:   Inadequate oral intake related to chronic illness, vomiting as evidenced by per patient/family report.  GOAL:   Patient will meet greater than or equal to 90% of their needs  MONITOR:   PO intake, Supplement acceptance, Labs, Weight trends  REASON FOR ASSESSMENT:   Malnutrition Screening Tool  ASSESSMENT:   55 year-old female with medical history of HTN, DVT/PE, palpitations on BB, gastric bypass surgery in 06/2014, excess alcohol consumption (4-5 shots per day), and complex migraines. She presented to the ED d/t hypoglycemia and palpitations. Family called her and noted she was altered. EMS arrived and discovered CBG of 40 mg/dl. Upon ER arrival she was found to be in Afib.  She ate 50% of breakfast this AM and was working on lunch during the time of RD visit. No family or visitors present at the time of visit.  Patient confirms that she had gastric bypass in 06/2014. She reports that she was doing very well until 2018 or 2019 when she began to experience vomiting with nearly all food intake. She has had several studies/tests done since that time but it remains unclear why this is occurring.   Patient reports that she is able to keep down things such as potato chips, jello, and pudding, but throws up nearly all solid foods. Vomiting occurs 0-30 minutes after intake and if she makes it past the 30 minute mark then she will not vomit. Emesis contains stomach acid, partially digested food, and saliva.   She reports that she has  continued to take multivitamin, calcium supplementation, and vitamin B12 supplementation since gastric bypass.   Weight on 7/17 was documented as 200 lb, which appears to be a stated weight. Weight on 5/31 at [facility] was 188 lb, and weight on 4/23 within [facility] was 181 lb.   Per notes: - newly dx afib with RVR - recurrent hypoglycemia - hx of alcohol abuse with no indication of withdrawal at this time - uncontrolled HTN   Labs reviewed.  Medications reviewed; daily iron supplementation, 40 mg oral protonix BID, 40 mEq Klor-Con x2 doses 7/18, 1000 mcg oral cyanocobalamin/day.     NUTRITION - FOCUSED PHYSICAL EXAM:  Completed; no muscle or fat depletions.  Diet Order:   Diet Order             Diet regular Room service appropriate? Yes; Fluid consistency: Thin  Diet effective now                   EDUCATION NEEDS:   No education needs have been identified at this time  Skin:  Skin Assessment: Reviewed RN Assessment  Last BM:  7/17  Height:   Ht Readings from Last 1 Encounters:  05/29/21 5\' 7"  (1.702 m)    Weight:   Wt Readings from Last 1 Encounters:  05/29/21 90.7 kg      Estimated Nutritional Needs:  Kcal:  1725-1950 kcal Protein:  80-90 grams Fluid:  >/= 2 L/day       Jarome Matin, MS, RD, LDN, CNSC Inpatient Clinical Dietitian RD pager #  available in AMION  After hours/weekend pager # available in AMION  

## 2021-05-31 NOTE — Plan of Care (Signed)
  Problem: Nutrition: Goal: Adequate nutrition will be maintained Outcome: Progressing   Problem: Coping: Goal: Level of anxiety will decrease Outcome: Progressing   Problem: Pain Managment: Goal: General experience of comfort will improve Outcome: Progressing   Problem: Education: Goal: Knowledge of disease or condition will improve Outcome: Progressing

## 2021-05-31 NOTE — Progress Notes (Signed)
[name]  AGT:[id number] DOB: 17-Sep-1966 DOA: 05/29/2021 PCP: Andria Frames, PA-C    Brief Narrative:  [phone] w/ hx of HTN, DVT/PE, palpitations on BB, gastric bypass surgery, excess alcohol consumption (4-5 shots per day), and complex migraines who presented to the ED w/ hypoglycemia and palpitations. Family called her and noted she was altered. EMS arrived and discovered CBG of 40. Upon ER arrival she was thought to be in Afib.   Consultants:  None  Code Status: FULL CODE  Antimicrobials:  none  DVT prophylaxis: Lovenox   Subjective: C/o ongoing generalized HA, as well as neck pain L > R radiating into the L scapular region. Is alert and oriented. Denies cp, n/v, or abdom pain. Reports poor appetite, but is able to eat w/o difficulty. I questioned her about phentermine, which is listed on her home medication list, and she reports never having heard of this medicine, stating she is not now nor never has she taken it.   Assessment & Plan:  ?Newly diagnosed Afib w/ RVR v/s sinus tachycardia  Review of EKG from ER not fully convincing of true Afib - TSH normal - pt currently in NSR - Mg normal - TSH normal - lovenox for anticoag temporarily - f/u EKG this AM - check pricing of eliquis w/ help from [facility] in case decision made to cont anticoag - TTE 05/30/21 unrevealing w/ preserved EF   Hypoglycemia  Has not recurred to point of altered mental status since admission - random cortisol normal - perhaps this was related to EtOH? - follow CBG over night off dextrose   Hypokalemia  Supplemented to goal of 4.0   Alcoholism No evidence of withdrawal at this time - counseled on abstinence with possible connection to hypoglycemia, as well as other deleterious effects of excess use   HLD Not on a statin at home   Uncontrolled HTN BP improved - follow w/o change for now   Pain in L neck/shoulder Prn pain meds - trial of oral narcotic as pt describes allergy as "palpitations" instead  of a true histaminergic type reaction - given AMS I am concerned she may have suffered a fall at home unbeknownst to family/herself - check CT head and cervical spine to r/o traumatic injury   Obesity - Body mass index is 31.32 kg/m.    Family Communication: no family present at time of admit  Status is: Inpatient  Remains inpatient appropriate because:Inpatient level of care appropriate due to severity of illness  Dispo: The patient is from: Home              Anticipated d/c is to: Home              Patient currently is not medically stable to d/c.   Difficult to place patient No   Objective: Blood pressure (!) 139/93, pulse 64, temperature 97.7 F (36.5 C), resp. rate 16, height 5\' 7"  (1.702 m), weight 90.7 kg, SpO2 98 %.  Intake/Output Summary (Last 24 hours) at 05/31/2021 0837 Last data filed at 05/31/2021 0600 Gross per 24 hour  Intake 1001.76 ml  Output --  Net 1001.76 ml   Filed Weights   05/29/21 1828  Weight: 90.7 kg    Examination: General: No acute respiratory distress Lungs: CTA B - no wheezing Cardiovascular: RRR w/o M or rub  Abdomen: NT/ND, soft, BS+, no mass  Extremities: No significant cyanosis, clubbing, or edema bilateral lower extremities  CBC: Recent Labs  Lab 05/29/21 1845 05/30/21  0552  WBC 8.4 7.1  NEUTROABS 6.7  --   HGB 13.1 13.6  HCT 42.9 43.6  MCV 94.5 92.8  PLT 107* 338   Basic Metabolic Panel: Recent Labs  Lab 05/29/21 1845 05/30/21 1245 05/31/21 0429  NA 141 140 143  K 2.9* 3.6 4.2  CL 106 104 106  CO2 26 28 29   GLUCOSE 115* 94 112*  BUN 13 14 12   CREATININE 0.59 0.69 0.80  CALCIUM 8.8* 9.5 9.4  MG  --  2.0 1.9   GFR: Estimated Creatinine Clearance: 89.7 mL/min (by C-G formula based on SCr of 0.8 mg/dL).  Liver Function Tests: Recent Labs  Lab 05/29/21 1845 05/31/21 0429  AST 34 25  ALT 17 15  ALKPHOS 56 51  BILITOT 0.8 0.6  PROT 7.2 6.4*  ALBUMIN 3.7 3.2*    HbA1C: Hgb A1c MFr Bld  Date/Time Value Ref  Range Status  01/24/2009 01:45 PM  4.6 - 6.1 % Final   5.9 (NOTE)   The ADA recommends the following therapeutic goal for glycemic   control related to Hgb A1C measurement:   Goal of Therapy:   < 7.0% Hgb A1C   Reference: American Diabetes Association: Clinical Practice   Recommendations 2008, Diabetes Care,  2008, 31:(Suppl 1).    CBG: Recent Labs  Lab 05/29/21 2217 05/30/21 0029 05/30/21 0813 05/30/21 0854 05/30/21 1906  GLUCAP 73 89 69* 95 100*    Recent Results (from the past 240 hour(s))  Resp Panel by RT-PCR (Flu A&B, Covid) Nasopharyngeal Swab     Status: None   Collection Time: 05/29/21  7:27 PM   Specimen: Nasopharyngeal Swab; Nasopharyngeal(NP) swabs in vial transport medium  Result Value Ref Range Status   SARS Coronavirus 2 by RT PCR NEGATIVE NEGATIVE Final    Comment: (NOTE) SARS-CoV-2 target nucleic acids are NOT DETECTED.  The SARS-CoV-2 RNA is generally detectable in upper respiratory specimens during the acute phase of infection. The lowest concentration of SARS-CoV-2 viral copies this assay can detect is 138 copies/mL. A negative result does not preclude SARS-Cov-2 infection and should not be used as the sole basis for treatment or other patient management decisions. A negative result may occur with  improper specimen collection/handling, submission of specimen other than nasopharyngeal swab, presence of viral mutation(s) within the areas targeted by this assay, and inadequate number of viral copies(<138 copies/mL). A negative result must be combined with clinical observations, patient history, and epidemiological information. The expected result is Negative.  Fact Sheet for Patients:  EntrepreneurPulse.com.au  Fact Sheet for Healthcare Providers:  IncredibleEmployment.be  This test is no t yet approved or cleared by the [place] FDA and  has been authorized for detection and/or diagnosis of SARS-CoV-2 by FDA  under an Emergency Use Authorization (EUA). This EUA will remain  in effect (meaning this test can be used) for the duration of the COVID-19 declaration under Section 564(b)(1) of the Act, 21 U.S.C.section 360bbb-3(b)(1), unless the authorization is terminated  or revoked sooner.       Influenza A by PCR NEGATIVE NEGATIVE Final   Influenza B by PCR NEGATIVE NEGATIVE Final    Comment: (NOTE) The Xpert Xpress SARS-CoV-2/FLU/RSV plus assay is intended as an aid in the diagnosis of influenza from Nasopharyngeal swab specimens and should not be used as a sole basis for treatment. Nasal washings and aspirates are unacceptable for Xpert Xpress SARS-CoV-2/FLU/RSV testing.  Fact Sheet for Patients: EntrepreneurPulse.com.au  Fact Sheet for Healthcare Providers: IncredibleEmployment.be  This test  is not yet approved or cleared by the [place] and has been authorized for detection and/or diagnosis of SARS-CoV-2 by FDA under an Emergency Use Authorization (EUA). This EUA will remain in effect (meaning this test can be used) for the duration of the COVID-19 declaration under Section 564(b)(1) of the Act, 21 U.S.C. section 360bbb-3(b)(1), unless the authorization is terminated or revoked.  Performed at [facility], [place] [address] [place], [place] [address]      Scheduled Meds:  calcium citrate  950 mg Oral Daily   cycloSPORINE  1 drop Both Eyes BID   enoxaparin (LOVENOX) injection  90 mg Subcutaneous Q12H   feeding supplement  237 mL Oral BID BM   gabapentin  300 mg Oral TID   hydrOXYzine  25 mg Oral TID   Iron 21/7   Oral Daily   Lifitegrast  1 drop Both Eyes Daily   metoprolol succinate  25 mg Oral Daily   multivitamin with minerals  1 tablet Oral TID   pantoprazole  40 mg Oral BID   sertraline  200 mg Oral Daily   vitamin B-12  1,000 mcg Oral Daily      LOS: 2 days   Cherene Altes, MD [facility] Office  [phone] Pager - Text Page per Shea Evans  If 7PM-7AM, please contact night-coverage per Amion 05/31/2021, 8:37 AM

## 2021-05-31 NOTE — Discharge Instructions (Addendum)
Cardiology with [facility] will call you to arrange the monitor.  You will then follow up in Sept with Dr. Margaretann Loveless the cardiologist.   This is for rapid heart rate you had in emergency room.

## 2021-06-01 DIAGNOSIS — E876 Hypokalemia: Secondary | ICD-10-CM | POA: Diagnosis not present

## 2021-06-01 DIAGNOSIS — E162 Hypoglycemia, unspecified: Secondary | ICD-10-CM | POA: Diagnosis not present

## 2021-06-01 DIAGNOSIS — I4891 Unspecified atrial fibrillation: Secondary | ICD-10-CM | POA: Diagnosis not present

## 2021-06-01 MED ORDER — METOPROLOL SUCCINATE ER 25 MG PO TB24: 25.0000 mg | ORAL_TABLET | Freq: Every day | ORAL | 0 refills | Status: AC

## 2021-06-01 NOTE — Progress Notes (Signed)
Provided and discussed discharge information. Addressed all questions and concerns. Iv removed intact.  Jerene Pitch

## 2021-06-01 NOTE — Discharge Summary (Signed)
DISCHARGE SUMMARY  [name]  MR#: [id number]  DOB:11/24/1965  Date of Admission: 05/29/2021 Date of Discharge: 06/01/2021  Attending Physician:Henrry Feil Hennie Duos, MD  Patient's MVH:QIONGEX, Angelica Katayama, PA-C  Consults: none   Disposition: D/C home    Follow-up Appts:  Follow-up Information     Elouise Munroe, MD Follow up on 07/19/2021.   Specialties: Cardiology, Radiology Why: at 10:00 AM with Cardiology Contact information: [address] [place] [address] [phone]         Park Meo T, PA-C Follow up in 1 week(s).   Specialty: Physician Assistant Contact information: [place] [address] [phone]                 Tests Needing Follow-up: -assess for further episodes of symptomatic hypoglycemia  -f/u renal fxn and K+ on HCTZ therapy  -question on abstinence from EtOH  Discharge Diagnoses: ?Newly diagnosed Afib w/ RVR v/s Sinus Tachycardia v/s Atrial Arrhythmia NOS  Hypoglycemia Hypokalemia Possible Excess Alcohol consumption - in doubt  HLD Uncontrolled HTN Pain in L neck/shoulder Obesity - Body mass index is 31.32 kg/m.  Initial presentation: 55yo w/ hx of HTN, DVT/PE, palpitations, gastric bypass surgery, and complex migraines who presented to the ED w/ hypoglycemia and palpitations. Family called her and noted she was altered. EMS arrived and discovered CBG of 40. Upon ER arrival she was thought to be in Afib.  Hospital Course:  ?Newly diagnosed Afib w/ RVR v/s sinus tachycardia Review of EKG from ER not fully convincing of true Afib - TSH normal - pt remained in NSR tho hospital stay - Mg normal - TSH normal - lovenox for anticoag utilized temporarily - f/u EKG 7/19 again c/w NSR - TTE 05/30/21 unrevealing w/ preserved EF - Cardiology to arrange for 2 week home Zio patch heart monitor and outpatient f/u - will not anticoagulate at this time as there is no compelling evidence she  actually has Afib - discussed at length w/ pt and her sister at bedside    Hypoglycemia Has not recurred to point of altered mental status since admission - random cortisol normal - perhaps this was related to EtOH? - CBG remained stable off IV dextrose - pt advised to avoid EtOH - more extensive workup can be accomplished as outpt if sx occur, but for now it is not felt warranted to prolong her inpatient stay to do so as her CBG has been stable off dextrose, and she has been asymptomatic    Hypokalemia Supplemented to goal of 4.0 - likely simply due to poor intake    Excess Alcohol consumption - patient denies  The admitting MD reported a hx of "drinking on a regular basis" and also "on a regular basis she drinks 4-5 shots daily" - the pt herself reported to this examiner that she "only rarely drinks" and not ever to excess - no evidence of withdrawal during this admission - counseled on abstinence with possible connection to hypoglycemia, as well as other deleterious effects of excess use - unclear at this time if excess consumption is a true issue, but his could explain her hypoglycemia - she has been educated on this, and instructed to not drink alcohol at all    HLD Not on a statin at home - f/u as outpt - LDL this admit 97, but non-fasting    Uncontrolled HTN BP stable during hospital stay - resume usual home medications at d/c    Pain in  L neck/shoulder Prn pain meds - trial of oral narcotic was tolerated well (pt describes "allergy" as "palpitations" instead of a true histaminergic type reaction) - CT head and cervical spine with no acute abnormalities noted    Obesity - Body mass index is 31.32 kg/m.  Allergies as of 06/01/2021       Reactions   Darvocet [propoxyphene N-acetaminophen] Other (See Comments)   Some kind of reaction during surgery   Other Other (See Comments)   Oxycodone-acetaminophen Itching   Does not remember   Percocet [oxycodone-acetaminophen] Other (See  Comments)   Does not remember   Vicodin [hydrocodone-acetaminophen] Other (See Comments)   Heart to flutter   Hydrocodone-acetaminophen Palpitations   Heart to flutter        Medication List     STOP taking these medications    multivitamin capsule       TAKE these medications    aspirin 81 MG EC tablet Take 1 tablet (81 mg total) by mouth daily.   Calcium Citrate 200 MG Tabs Take 200 mg by mouth daily.   Cyanocobalamin 1000 MCG Tbcr Take 1,000 mcg by mouth daily.   cyclobenzaprine 10 MG tablet Commonly known as: FLEXERIL Take 10 mg by mouth 2 (two) times daily as needed.   cycloSPORINE 0.05 % ophthalmic emulsion Commonly known as: RESTASIS Place 1 drop into both eyes at bedtime.   dicyclomine 20 MG tablet Commonly known as: BENTYL Take 20 mg by mouth 4 (four) times daily.   ferrous sulfate 325 (65 FE) MG tablet Take 325 mg by mouth daily with breakfast.   gabapentin 300 MG capsule Commonly known as: NEURONTIN Take 300 mg by mouth 3 (three) times daily.   hydrochlorothiazide 25 MG tablet Commonly known as: HYDRODIURIL Take 25 mg by mouth daily.   hydrOXYzine 25 MG capsule Commonly known as: VISTARIL Take 25 mg by mouth 3 (three) times daily.   IRON 21/7 PO Take 1 tablet by mouth daily.   metoprolol succinate 25 MG 24 hr tablet Commonly known as: TOPROL-XL Take 1 tablet (25 mg total) by mouth daily. What changed: when to take this   nitroGLYCERIN 0.4 MG SL tablet Commonly known as: NITROSTAT Place 1 tablet (0.4 mg total) under the tongue every 5 (five) minutes as needed for chest pain.   OVER THE COUNTER MEDICATION Take 1 tablet by mouth in the morning, at noon, and at bedtime. Bariatric multi vit   pantoprazole 40 MG tablet Commonly known as: PROTONIX Take 1 tablet (40 mg total) by mouth 2 (two) times daily.   sertraline 100 MG tablet Commonly known as: ZOLOFT Take 2 tablets by mouth daily.   topiramate 100 MG tablet Commonly known as:  TOPAMAX Take 1 tablet (100 mg total) by mouth at bedtime.   traZODone 100 MG tablet Commonly known as: DESYREL Take 1-2 tablets by mouth at bedtime as needed for sleep.   Xiidra 5 % Soln Generic drug: Lifitegrast Place 1 drop into both eyes daily.        Day of Discharge BP 114/81 (BP Location: Right Arm)   Pulse 71   Temp 98.2 F (36.8 C) (Oral)   Resp 20   Ht 5\' 7"  (1.702 m)   Wt 90.7 kg   SpO2 100%   BMI 31.32 kg/m   Physical Exam: General: No acute respiratory distress Lungs: Clear to auscultation bilaterally without wheezes or crackles Cardiovascular: Regular rate and rhythm without murmur gallop or rub normal S1 and S2 Abdomen: Nontender,  nondistended, soft, bowel sounds positive, no rebound, no ascites, no appreciable mass - obese Extremities: No significant cyanosis, clubbing, or edema bilateral lower extremities  Basic Metabolic Panel: Recent Labs  Lab 05/29/21 1845 05/30/21 1245 05/31/21 0429  NA 141 140 143  K 2.9* 3.6 4.2  CL 106 104 106  CO2 26 28 29   GLUCOSE 115* 94 112*  BUN 13 14 12   CREATININE 0.59 0.69 0.80  CALCIUM 8.8* 9.5 9.4  MG  --  2.0 1.9    Liver Function Tests: Recent Labs  Lab 05/29/21 1845 05/31/21 0429  AST 34 25  ALT 17 15  ALKPHOS 56 51  BILITOT 0.8 0.6  PROT 7.2 6.4*  ALBUMIN 3.7 3.2*    CBC: Recent Labs  Lab 05/29/21 1845 05/30/21 0552  WBC 8.4 7.1  NEUTROABS 6.7  --   HGB 13.1 13.6  HCT 42.9 43.6  MCV 94.5 92.8  PLT 107* 239    CBG: Recent Labs  Lab 05/29/21 2217 05/30/21 0029 05/30/21 0813 05/30/21 0854 05/30/21 1906  GLUCAP 73 89 69* 95 100*    Recent Results (from the past 240 hour(s))  Resp Panel by RT-PCR (Flu A&B, Covid) Nasopharyngeal Swab     Status: None   Collection Time: 05/29/21  7:27 PM   Specimen: Nasopharyngeal Swab; Nasopharyngeal(NP) swabs in vial transport medium  Result Value Ref Range Status   SARS Coronavirus 2 by RT PCR NEGATIVE NEGATIVE Final    Comment:  (NOTE) SARS-CoV-2 target nucleic acids are NOT DETECTED.  The SARS-CoV-2 RNA is generally detectable in upper respiratory specimens during the acute phase of infection. The lowest concentration of SARS-CoV-2 viral copies this assay can detect is 138 copies/mL. A negative result does not preclude SARS-Cov-2 infection and should not be used as the sole basis for treatment or other patient management decisions. A negative result may occur with  improper specimen collection/handling, submission of specimen other than nasopharyngeal swab, presence of viral mutation(s) within the areas targeted by this assay, and inadequate number of viral copies(<138 copies/mL). A negative result must be combined with clinical observations, patient history, and epidemiological information. The expected result is Negative.  Fact Sheet for Patients:  EntrepreneurPulse.com.au  Fact Sheet for Healthcare Providers:  IncredibleEmployment.be  This test is no t yet approved or cleared by the [place] FDA and  has been authorized for detection and/or diagnosis of SARS-CoV-2 by FDA under an Emergency Use Authorization (EUA). This EUA will remain  in effect (meaning this test can be used) for the duration of the COVID-19 declaration under Section 564(b)(1) of the Act, 21 U.S.C.section 360bbb-3(b)(1), unless the authorization is terminated  or revoked sooner.       Influenza A by PCR NEGATIVE NEGATIVE Final   Influenza B by PCR NEGATIVE NEGATIVE Final    Comment: (NOTE) The Xpert Xpress SARS-CoV-2/FLU/RSV plus assay is intended as an aid in the diagnosis of influenza from Nasopharyngeal swab specimens and should not be used as a sole basis for treatment. Nasal washings and aspirates are unacceptable for Xpert Xpress SARS-CoV-2/FLU/RSV testing.  Fact Sheet for Patients: EntrepreneurPulse.com.au  Fact Sheet for Healthcare  Providers: IncredibleEmployment.be  This test is not yet approved or cleared by the [place] FDA and has been authorized for detection and/or diagnosis of SARS-CoV-2 by FDA under an Emergency Use Authorization (EUA). This EUA will remain in effect (meaning this test can be used) for the duration of the COVID-19 declaration under Section 564(b)(1) of the Act, 21 U.S.C. section  360bbb-3(b)(1), unless the authorization is terminated or revoked.  Performed at [facility], [place] [address] [place], [place] [address]       Time spent in discharge (includes decision making & examination of pt): 35 minutes  06/01/2021, 4:18 PM   Cherene Altes, MD [facility] Office  [phone]

## 2021-06-08 ENCOUNTER — Ambulatory Visit (INDEPENDENT_AMBULATORY_CARE_PROVIDER_SITE_OTHER): Payer: 59

## 2021-06-08 DIAGNOSIS — I471 Supraventricular tachycardia: Secondary | ICD-10-CM | POA: Diagnosis not present

## 2021-06-28 ENCOUNTER — Telehealth (HOSPITAL_BASED_OUTPATIENT_CLINIC_OR_DEPARTMENT_OTHER): Payer: Self-pay | Admitting: *Deleted

## 2021-06-28 NOTE — Telephone Encounter (Signed)
Received [employer] report on patient from 8/15 10:03 pm CT  Report analysis:  SVT (>40 sec), SR with run of V-Tach (3 beats)/Couplet PVC's/PVC's HR 166 Auto Trigger  8/15 9:58 pm CT   Report analysis:   SR with PSVT Auto Trigger  Reviewed with Dr Oval Linsey, continue to monitor and call patient to see how she was feeling  Left message to call back  Patient has never been seen by [facility] and had new patient consult with [place] cardiologist 8/11, has appointment with Dr Margaretann Loveless Lynden Ang

## 2021-07-14 NOTE — Telephone Encounter (Signed)
Patient never returned call, monitor results below   Skeet Latch, MD  07/12/2021  4:32 PM EDT     Monitor showed some extra heartbeats from the top and bottom chambers of the heart.`Study result was routed to her primary cardiologist which is not in the [facility] health system.   Released in my chart with Dr Blenda Mounts comments attached however patient never viewed. Printed, highlighted comments, and mailed to patient

## 2021-07-19 ENCOUNTER — Ambulatory Visit: Payer: 59 | Admitting: Internal Medicine

## 2022-02-20 ENCOUNTER — Telehealth: Payer: Self-pay | Admitting: Family Medicine

## 2022-02-20 NOTE — Telephone Encounter (Signed)
Patient want a nurse to call her, she have some questions to ask about her IUD before she get it removed.  ?

## 2022-02-21 NOTE — Telephone Encounter (Signed)
Called pt; VM left stating I am returning pt's call and pt may return call or send MyChart message to discuss concerns. ?

## 2022-02-21 NOTE — Telephone Encounter (Signed)
Pt returned call. States Stacie Acres was placed 04/19/2016 for heavy bleeding. Explained this is good for 5 years for bleeding management and 8 years to prevent pregnancy. Pt would like to schedule appt with provider to discuss if she should have this replaced. Scheduled first available with Ilda Basset, MD on 05/12/22. Pt will call back to try to reschedule with Roselie Awkward, MD due to preference. No available appts for [facility] at this time. ?

## 2022-05-12 ENCOUNTER — Ambulatory Visit: Payer: 59 | Admitting: Obstetrics and Gynecology

## 2022-07-10 ENCOUNTER — Encounter (HOSPITAL_COMMUNITY): Payer: Self-pay

## 2022-07-10 ENCOUNTER — Emergency Department (HOSPITAL_COMMUNITY)
Admission: EM | Admit: 2022-07-10 | Discharge: 2022-07-10 | Discharge status: Against Medical Advice | Payer: 59 | Attending: Emergency Medicine | Admitting: Emergency Medicine

## 2022-07-10 ENCOUNTER — Emergency Department (HOSPITAL_COMMUNITY): Payer: 59

## 2022-07-10 DIAGNOSIS — Z5321 Procedure and treatment not carried out due to patient leaving prior to being seen by health care provider: Secondary | ICD-10-CM | POA: Diagnosis not present

## 2022-07-10 DIAGNOSIS — R002 Palpitations: Secondary | ICD-10-CM | POA: Diagnosis present

## 2023-01-11 ENCOUNTER — Encounter: Payer: Self-pay | Admitting: Nurse Practitioner

## 2023-01-11 ENCOUNTER — Telehealth: Payer: 59

## 2023-03-07 ENCOUNTER — Ambulatory Visit: Payer: 59 | Admitting: Obstetrics & Gynecology

## 2023-12-18 NOTE — Progress Notes (Signed)
 This encounter was created in error - please disregard.

## 2024-01-18 ENCOUNTER — Encounter (HOSPITAL_COMMUNITY): Payer: Self-pay

## 2024-01-18 ENCOUNTER — Emergency Department (HOSPITAL_COMMUNITY)
Admission: EM | Admit: 2024-01-18 | Discharge: 2024-01-18 | Disposition: A | Discharge status: Home / Self Care | Attending: Emergency Medicine | Admitting: Emergency Medicine

## 2024-01-18 ENCOUNTER — Other Ambulatory Visit: Payer: Self-pay

## 2024-01-18 ENCOUNTER — Emergency Department (HOSPITAL_COMMUNITY)

## 2024-01-18 DIAGNOSIS — J101 Influenza due to other identified influenza virus with other respiratory manifestations: Secondary | ICD-10-CM | POA: Insufficient documentation

## 2024-01-18 DIAGNOSIS — M545 Low back pain, unspecified: Secondary | ICD-10-CM | POA: Diagnosis not present

## 2024-01-18 DIAGNOSIS — Z79899 Other long term (current) drug therapy: Secondary | ICD-10-CM | POA: Diagnosis not present

## 2024-01-18 DIAGNOSIS — R350 Frequency of micturition: Secondary | ICD-10-CM | POA: Diagnosis not present

## 2024-01-18 DIAGNOSIS — R509 Fever, unspecified: Secondary | ICD-10-CM | POA: Diagnosis present

## 2024-01-18 DIAGNOSIS — R109 Unspecified abdominal pain: Secondary | ICD-10-CM | POA: Insufficient documentation

## 2024-01-18 DIAGNOSIS — Z7901 Long term (current) use of anticoagulants: Secondary | ICD-10-CM | POA: Diagnosis not present

## 2024-01-18 DIAGNOSIS — Z7982 Long term (current) use of aspirin: Secondary | ICD-10-CM | POA: Diagnosis not present

## 2024-01-18 DIAGNOSIS — N200 Calculus of kidney: Secondary | ICD-10-CM

## 2024-01-18 DIAGNOSIS — I4891 Unspecified atrial fibrillation: Secondary | ICD-10-CM | POA: Diagnosis not present

## 2024-01-18 DIAGNOSIS — I10 Essential (primary) hypertension: Secondary | ICD-10-CM | POA: Diagnosis not present

## 2024-01-18 LAB — BASIC METABOLIC PANEL
Anion gap: 9 (ref 5–15)
BUN: 13 mg/dL (ref 6–20)
CO2: 26 mmol/L (ref 22–32)
Calcium: 8.4 mg/dL — ABNORMAL LOW (ref 8.9–10.3)
Chloride: 102 mmol/L (ref 98–111)
Creatinine, Ser: 0.79 mg/dL (ref 0.44–1.00)
GFR, Estimated: >60 mL/min (ref >60)
Glucose, Bld: 104 mg/dL — ABNORMAL HIGH (ref 70–99)
Potassium: 3.7 mmol/L (ref 3.5–5.1)
Sodium: 137 mmol/L (ref 135–145)

## 2024-01-18 LAB — URINALYSIS, ROUTINE W REFLEX MICROSCOPIC
Bacteria, UA: NONE SEEN
Bilirubin Urine: NEGATIVE
Glucose, UA: NEGATIVE mg/dL
Ketones, ur: NEGATIVE mg/dL
Leukocytes,Ua: NEGATIVE
Nitrite: NEGATIVE
Protein, ur: NEGATIVE mg/dL
Specific Gravity, Urine: 1.02 (ref 1.005–1.030)
pH: 6 (ref 5.0–8.0)

## 2024-01-18 LAB — PREGNANCY, URINE: Preg Test, Ur: NEGATIVE

## 2024-01-18 LAB — CBC
HCT: 43.8 % (ref 36.0–46.0)
Hemoglobin: 13.3 g/dL (ref 12.0–15.0)
MCH: 28 pg (ref 26.0–34.0)
MCHC: 30.4 g/dL (ref 30.0–36.0)
MCV: 92.2 fL (ref 80.0–100.0)
Platelets: 150 10*3/uL (ref 150–400)
RBC: 4.75 MIL/uL (ref 3.87–5.11)
RDW: 13.8 % (ref 11.5–15.5)
WBC: 6.7 10*3/uL (ref 4.0–10.5)
nRBC: 0 % (ref 0.0–0.2)

## 2024-01-18 LAB — I-STAT CG4 LACTIC ACID, ED: Lactic Acid, Venous: 0.9 mmol/L (ref 0.5–1.9)

## 2024-01-18 LAB — RESP PANEL BY RT-PCR (RSV, FLU A&B, COVID)  RVPGX2
Influenza A by PCR: POSITIVE — AB
Influenza B by PCR: NEGATIVE
Resp Syncytial Virus by PCR: NEGATIVE
SARS Coronavirus 2 by RT PCR: NEGATIVE

## 2024-01-18 LAB — CK: Total CK: 103 U/L (ref 38–234)

## 2024-01-18 MED ORDER — ACETAMINOPHEN 500 MG PO TABS
1000.0000 mg | ORAL_TABLET | Freq: Once | ORAL | Status: AC
  Administered 2024-01-18: 1000 mg via ORAL
  Filled 2024-01-18: qty 2

## 2024-01-18 NOTE — ED Provider Notes (Signed)
 Received patient at sign out from previous provider pending renal CT, CXR, resp panel. See his note  In short, patient presents for weakness, URI symptoms, abdominal pain, increased urinary frequency for the past week.  ED workup is significant for hemoglobin in the urine.  Patient is postmenopausal not currently on her menses. Renal study ordered to ensure no stone as cause of abd pain in setting of hematuria. She is febrile here at 101.8 F.  Tylenol provided improving temperature.  Resp panel positive for Influenza A. UA significant for moderate hemoglobin with no WBC, bacteria, nor nitrates. Will send urine culture but I have low suspicion for UTI. CT renal stone study significant for 2 mm renal stone with mild hydronephrosis.  No obvious obstructing stone noted.  Will provide patient with urology follow-up for further management. Discussed symptomatic management for small renal stone, and influenza. As she was on tamiflu for flu 2 weeks ago without much improvement, patient making is had with patient who declines Tamiflu at this time.  Discussed ED workup, disposition, return to ED precautions with patient expresses understanding agrees with plan.  All questions answered to her satisfaction.  She is agreeable discharge.     Judithann Sheen, PA 01/18/24 1729    Benjiman Core, MD 01/18/24 2352

## 2024-01-18 NOTE — Discharge Instructions (Addendum)
 Thank you for letting us evaluate you today.  Your lab work was significant for positive influenza. Your chest XR was negative for pneumonia Your CT scan showed a small 2 mm right renal stone and may be contributing to some blood in the urine.  However, your urine is not infectious requiring antibiotics at this time.  Please use tylenol and ibuprofen for pain at home, this stone should pass on its own. Provided you with urology follow-up for further management of urinary frequency and stone.  As for influenza, please use symptomatic treatment to include TheraFlu, Tylenol, ibuprofen, honey, tea, oral hydration  I have sent urine culture to ensure it does not grow anything although it does not appear to be infected here in ED  Return to emergency department if you have intractable vomiting, worsening flank pain especially in setting of burning with urination, increased frequency, difficulty urinating, urinary retention

## 2024-01-18 NOTE — ED Triage Notes (Signed)
 Patient presented to ER for frequent urination, low grade fever, abdominal pain, overall feeling more tired. Patient states last year she had similar pain that was a kidney infection and PE, taking blood thinners.

## 2024-01-18 NOTE — ED Provider Notes (Signed)
 [place] EMERGENCY DEPARTMENT AT [facility] Provider Note   CSN: [id number] Arrival date & time: 01/18/24  7829     History  Chief Complaint  Patient presents with   Abdominal Pain   Urinary Frequency    [name] is a 58 y.o. female.  The history is provided by the patient and medical records. No language interpreter was used.  Abdominal Pain Urinary Frequency Associated symptoms include abdominal pain.     [name] is a 58 year old female who presents to [facility] ED today with a 1 week history of weakness and urinary symptoms and a 3 day history of fever, myalgia, low back pain, non-productive cough, and nasal drainage. Patient states she feels how she felt when she had a kidney stone in the past. Patient has a PMH of SVT, Afib (on chronic Eliquis), hx of DVT, HTN, and anemia. Patient endorses profound weakness since last weekend, where her legs "gave out" and she fell in the bathroom. She denies head injury, LOC, or prolonged immobilization following fall. Patient denies any recent sick exposures, but admits to having the flu a few weeks ago. Patient endorses increased urinary frequency that is amber/ dark in color, but denies any dysuria or hematuria. No CP, palpitations, SOB, wheezing, nausea, vomiting, diarrhea, abdominal pain, syncope, or peripheral edema. Patient has been compliant with medications and has not had any recent changes or additions in medications.   Home Medications Prior to Admission medications   Medication Sig Start Date End Date Taking? Authorizing Provider  aspirin EC 81 MG EC tablet Take 1 tablet (81 mg total) by mouth daily. 03/09/17   Pearson Grippe, MD  Calcium Citrate 200 MG TABS Take 200 mg by mouth daily.     [provider]  Cyanocobalamin 1000 MCG TBCR Take 1,000 mcg by mouth daily.     [provider]  cyclobenzaprine (FLEXERIL) 10 MG tablet Take 10 mg by mouth 2 (two) times daily as needed. 02/18/21   [provider]  cycloSPORINE (RESTASIS) 0.05 % ophthalmic emulsion Place 1 drop into both eyes at bedtime. 03/03/20   [provider]  dicyclomine (BENTYL) 20 MG tablet Take 20 mg by mouth 4 (four) times daily.    [provider]  Fe Bisgly-Succ-C-Thre-B12-FA (IRON 21/7 PO) Take 1 tablet by mouth daily.    [provider]  ferrous sulfate 325 (65 FE) MG tablet Take 325 mg by mouth daily with breakfast.    [provider]  gabapentin (NEURONTIN) 300 MG capsule Take 300 mg by mouth 3 (three) times daily. 05/21/21   [provider]  hydrochlorothiazide (HYDRODIURIL) 25 MG tablet Take 25 mg by mouth daily.    [provider]  hydrOXYzine (VISTARIL) 25 MG capsule Take 25 mg by mouth 3 (three) times daily. 01/25/21   [provider]  Lifitegrast Benay Spice) 5 % SOLN Place 1 drop into both eyes daily. 03/03/20   [provider]  metoprolol succinate (TOPROL-XL) 25 MG 24 hr tablet Take 1 tablet (25 mg total) by mouth daily. 06/01/21   Lonia Blood, MD  nitroGLYCERIN (NITROSTAT) 0.4 MG SL tablet Place 1 tablet (0.4 mg total) under the tongue every 5 (five) minutes as needed for chest pain. 03/09/17   Pearson Grippe, MD  OVER THE COUNTER MEDICATION Take 1 tablet by mouth in the morning, at noon, and at bedtime. Bariatric multi vit    [provider]  pantoprazole (PROTONIX) 40 MG tablet Take 1  tablet (40 mg total) by mouth 2 (two) times daily. 03/09/17   Pearson Grippe, MD  sertraline (ZOLOFT) 100 MG tablet Take 2 tablets by mouth daily. 01/25/21   [provider]  topiramate (TOPAMAX) 100 MG tablet Take 1 tablet (100 mg total) by mouth at bedtime. 11/22/17   Anson Fret, MD  traZODone (DESYREL) 100 MG tablet Take 1-2 tablets by mouth at bedtime as needed for sleep. 01/25/21   [provider]  phentermine 30 MG capsule Take 30 mg by mouth daily.  04/25/17 06/01/21  [provider]      Allergies    Darvocet  [propoxyphene n-acetaminophen], Other, Oxycodone-acetaminophen, Percocet [oxycodone-acetaminophen], Vicodin [hydrocodone-acetaminophen], and Hydrocodone-acetaminophen    Review of Systems   Review of Systems  Gastrointestinal:  Positive for abdominal pain.  Genitourinary:  Positive for frequency.  All other systems reviewed and are negative.   Physical Exam Updated Vital Signs BP (!) 146/80 (BP Location: Left Arm)   Pulse 82   Temp (!) 101.8 F (38.8 C) (Oral)   Resp 18   Ht 5\' 7"  (1.702 m)   Wt 90.7 kg   SpO2 100%   BMI 31.32 kg/m  Physical Exam Vitals and nursing note reviewed.  Constitutional:      General: She is not in acute distress.    Appearance: She is well-developed.  HENT:     Head: Atraumatic.  Eyes:     Conjunctiva/sclera: Conjunctivae normal.  Cardiovascular:     Rate and Rhythm: Normal rate and regular rhythm.     Heart sounds: Normal heart sounds.  Pulmonary:     Effort: Pulmonary effort is normal.     Breath sounds: Rhonchi present. No wheezing or rales.  Abdominal:     Palpations: Abdomen is soft.     Tenderness: There is no abdominal tenderness.  Musculoskeletal:        General: Normal range of motion.     Cervical back: Neck supple.  Skin:    Findings: No rash.  Neurological:     Mental Status: She is alert. Mental status is at baseline.  Psychiatric:        Mood and Affect: Mood normal.     ED Results / Procedures / Treatments   Labs (all labs ordered are listed, but only abnormal results are displayed) Labs Reviewed  URINALYSIS, ROUTINE W REFLEX MICROSCOPIC - Abnormal; Notable for the following components:      Result Value   Hgb urine dipstick MODERATE (*)    All other components within normal limits  BASIC METABOLIC PANEL  CBC    EKG None  Radiology No results found.  Procedures Procedures    Medications Ordered in ED Medications - No data to display  ED Course/ Medical Decision Making/ A&P                                  Medical Decision Making Amount and/or Complexity of Data Reviewed Labs: ordered. Radiology: ordered.  Risk OTC drugs.   BP (!) 166/89   Pulse 79   Temp (!) 101.8 F (38.8 C) (Oral)   Resp 16   Ht 5\' 7"  (1.702 m)   Wt 90.7 kg   SpO2 100%   BMI 31.32 kg/m   1:24 PM [name] is a 58 year old female who presents to [facility] ED today with a 1 week history of weakness and urinary symptoms and a 3  day history of fever, myalgia, low back pain, non-productive cough, and nasal drainage. Patient states she feels how she felt when she had a kidney stone in the past. Patient has a PMH of SVT, Afib (on chronic Eliquis), hx of DVT, HTN, and anemia. Patient endorses profound weakness since last weekend, where her legs "gave out" and she fell in the bathroom. She denies head injury, LOC, or prolonged immobilization following fall. Patient denies any recent sick exposures, but admits to having the flu a few weeks ago. Patient endorses increased urinary frequency that is amber/ dark in color, but denies any dysuria or hematuria. No CP, palpitations, SOB, wheezing, nausea, vomiting, diarrhea, abdominal pain, syncope, or peripheral edema. Patient has been compliant with medications and has not had any recent changes or additions in medications.   On exam, patient does have some rhonchorous lung sounds, she sounds congested.  The skin is warm to the touch.  Abdomen is soft nontender.  She is mentating appropriately.  She exhibited no nuchal rigidity concern for meningitis.  Vitals are notable for fever of 101.8.  Tylenol given.  No hypoxia.  Blood pressure is elevated at 166/89.  -Labs ordered, independently viewed and interpreted by me.  Labs remarkable for UA with moderate Hgb.  No UTI.  Resp panel pending -The patient was maintained on a cardiac monitor.  I personally viewed and interpreted the cardiac monitored which showed an underlying rhythm of: NSR -Imaging including CXR and CT renal currently  pending -This patient presents to the ED for concern of cold sxs, this involves an extensive number of treatment options, and is a complaint that carries with it a high risk of complications and morbidity.  The differential diagnosis includes covid/flu/rsv, pneumonia, UTI, kidney stone -Co morbidities that complicate the patient evaluation includes obesity, migraine, PE, HTN -Treatment includes tylenol -Reevaluation of the patient after these medicines showed that the patient improved -PCP office notes or outside notes reviewed -Discussion with oncoming provider who will f/u on labs and imaging, reassess and determine disposition -Escalation to admission/observation considered: dispo pending         Final Clinical Impression(s) / ED Diagnoses Final diagnoses:  None    Rx / DC Orders ED Discharge Orders     None         Fayrene Helper, PA-C 01/18/24 1500    Linwood Dibbles, MD 01/19/24 [phone]

## 2024-01-21 LAB — URINE CULTURE

## 2024-01-22 ENCOUNTER — Telehealth (HOSPITAL_BASED_OUTPATIENT_CLINIC_OR_DEPARTMENT_OTHER): Payer: Self-pay | Admitting: *Deleted

## 2024-01-22 NOTE — Progress Notes (Signed)
 ED Antimicrobial Stewardship Positive Culture Follow Up   [name] is an 58 y.o. female who presented to [facility] on 01/18/2024 with a chief complaint of  Chief Complaint  Patient presents with   Abdominal Pain   Urinary Frequency    Recent Results (from the past 720 hours)  Resp panel by RT-PCR (RSV, Flu A&B, Covid) Anterior Nasal Swab     Status: Abnormal   Collection Time: 01/18/24 12:42 PM   Specimen: Anterior Nasal Swab  Result Value Ref Range Status   SARS Coronavirus 2 by RT PCR NEGATIVE NEGATIVE Final    Comment: (NOTE) SARS-CoV-2 target nucleic acids are NOT DETECTED.  The SARS-CoV-2 RNA is generally detectable in upper respiratory specimens during the acute phase of infection. The lowest concentration of SARS-CoV-2 viral copies this assay can detect is 138 copies/mL. A negative result does not preclude SARS-Cov-2 infection and should not be used as the sole basis for treatment or other patient management decisions. A negative result may occur with  improper specimen collection/handling, submission of specimen other than nasopharyngeal swab, presence of viral mutation(s) within the areas targeted by this assay, and inadequate number of viral copies(<138 copies/mL). A negative result must be combined with clinical observations, patient history, and epidemiological information. The expected result is Negative.  Fact Sheet for Patients:  BloggerCourse.com  Fact Sheet for Healthcare Providers:  SeriousBroker.it  This test is no t yet approved or cleared by the [place] FDA and  has been authorized for detection and/or diagnosis of SARS-CoV-2 by FDA under an Emergency Use Authorization (EUA). This EUA will remain  in effect (meaning this test can be used) for the duration of the COVID-19 declaration under Section 564(b)(1) of the Act, 21 U.S.C.section 360bbb-3(b)(1), unless the authorization is terminated  or  revoked sooner.       Influenza A by PCR POSITIVE (A) NEGATIVE Final   Influenza B by PCR NEGATIVE NEGATIVE Final    Comment: (NOTE) The Xpert Xpress SARS-CoV-2/FLU/RSV plus assay is intended as an aid in the diagnosis of influenza from Nasopharyngeal swab specimens and should not be used as a sole basis for treatment. Nasal washings and aspirates are unacceptable for Xpert Xpress SARS-CoV-2/FLU/RSV testing.  Fact Sheet for Patients: BloggerCourse.com  Fact Sheet for Healthcare Providers: SeriousBroker.it  This test is not yet approved or cleared by the [place] FDA and has been authorized for detection and/or diagnosis of SARS-CoV-2 by FDA under an Emergency Use Authorization (EUA). This EUA will remain in effect (meaning this test can be used) for the duration of the COVID-19 declaration under Section 564(b)(1) of the Act, 21 U.S.C. section 360bbb-3(b)(1), unless the authorization is terminated or revoked.     Resp Syncytial Virus by PCR NEGATIVE NEGATIVE Final    Comment: (NOTE) Fact Sheet for Patients: BloggerCourse.com  Fact Sheet for Healthcare Providers: SeriousBroker.it  This test is not yet approved or cleared by the [place] FDA and has been authorized for detection and/or diagnosis of SARS-CoV-2 by FDA under an Emergency Use Authorization (EUA). This EUA will remain in effect (meaning this test can be used) for the duration of the COVID-19 declaration under Section 564(b)(1) of the Act, 21 U.S.C. section 360bbb-3(b)(1), unless the authorization is terminated or revoked.  Performed at [facility], [address] [place], [place] [address]   Urine Culture     Status: Abnormal   Collection Time: 01/18/24  5:30 PM   Specimen: Urine, Clean Catch  Result Value  Ref Range Status   Specimen Description   Final    URINE, CLEAN  CATCH Performed at [facility], [address] [place], [place] [address]    Special Requests   Final    NONE Performed at [facility], [address], [place], [place] [address]    Culture (A)  Final    50,000 COLONIES/mL GARDNERELLA VAGINALIS Standardized susceptibility testing for this organism is not available. WITHIN MIXED ORGANISMS Performed at [facility], [address], [place], [place] [address]    Report Status 01/21/2024 FINAL  Final   [x]  Patient discharged originally without antimicrobial agent and treatment is now indicated  New antibiotic prescription: metronidazole 500mg  po BID x 7 days  ED Provider: Jasmine Pang, PA-C   [name] 01/22/2024, 7:58 AM Clinical Pharmacist Monday - Friday phone -  [phone] Saturday - Sunday phone - [phone]

## 2024-01-22 NOTE — Telephone Encounter (Signed)
 Post ED Visit - Positive Culture Follow-up: Successful Patient Follow-Up  Culture assessed and recommendations reviewed by:  []  Enzo Bi, Pharm.D. []  Celedonio Miyamoto, Pharm.D., BCPS AQ-ID []  Garvin Fila, Pharm.D., BCPS []  Georgina Pillion, Pharm.D., BCPS []  [place], [address].D., BCPS, AAHIVP []  Estella Husk, Pharm.D., BCPS, AAHIVP []  Lysle Pearl, PharmD, BCPS []  Phillips Climes, PharmD, BCPS []  Agapito Games, PharmD, BCPS []  Verlan Friends, PharmD  Positive urine culture  [x]  Patient discharged without antimicrobial prescription and treatment is now indicated []  Organism is resistant to prescribed ED discharge antimicrobial []  Patient with positive blood cultures  Changes discussed with ED provider: Jasmine Pang [facility] New antibiotic prescription Metronidazole 500 mg Po  BID for 7 days  Called to @[facility]  [phone]  Contacted patient, date 01/22/24, time 9010   Bing Quarry 01/22/2024, 9:14 AM
